# Patient Record
Sex: Male | Born: 1945 | Race: White | Hispanic: No | Marital: Married | Smoking: Current every day smoker
Health system: Southern US, Community
[De-identification: ages and names within clinical notes are randomized; demographics above are authoritative.]

## PROBLEM LIST (undated history)

## (undated) DIAGNOSIS — G4700 Insomnia, unspecified: Secondary | ICD-10-CM

## (undated) DIAGNOSIS — K219 Gastro-esophageal reflux disease without esophagitis: Secondary | ICD-10-CM

## (undated) DIAGNOSIS — R413 Other amnesia: Secondary | ICD-10-CM

## (undated) DIAGNOSIS — N529 Male erectile dysfunction, unspecified: Secondary | ICD-10-CM

## (undated) DIAGNOSIS — M545 Low back pain, unspecified: Secondary | ICD-10-CM

## (undated) DIAGNOSIS — F32A Depression, unspecified: Secondary | ICD-10-CM

## (undated) DIAGNOSIS — I4891 Unspecified atrial fibrillation: Secondary | ICD-10-CM

## (undated) DIAGNOSIS — N400 Enlarged prostate without lower urinary tract symptoms: Secondary | ICD-10-CM

## (undated) DIAGNOSIS — R918 Other nonspecific abnormal finding of lung field: Secondary | ICD-10-CM

## (undated) DIAGNOSIS — I10 Essential (primary) hypertension: Secondary | ICD-10-CM

## (undated) DIAGNOSIS — J449 Chronic obstructive pulmonary disease, unspecified: Secondary | ICD-10-CM

## (undated) DIAGNOSIS — I2510 Atherosclerotic heart disease of native coronary artery without angina pectoris: Secondary | ICD-10-CM

## (undated) DIAGNOSIS — F418 Other specified anxiety disorders: Secondary | ICD-10-CM

## (undated) DIAGNOSIS — F329 Major depressive disorder, single episode, unspecified: Secondary | ICD-10-CM

## (undated) DIAGNOSIS — K4090 Unilateral inguinal hernia, without obstruction or gangrene, not specified as recurrent: Secondary | ICD-10-CM

## (undated) DIAGNOSIS — G459 Transient cerebral ischemic attack, unspecified: Secondary | ICD-10-CM

## (undated) DIAGNOSIS — F5104 Psychophysiologic insomnia: Secondary | ICD-10-CM

## (undated) DIAGNOSIS — M1990 Unspecified osteoarthritis, unspecified site: Secondary | ICD-10-CM

## (undated) HISTORY — DX: Psychophysiologic insomnia: F51.04

## (undated) HISTORY — DX: Gastro-esophageal reflux disease without esophagitis: K21.9

## (undated) HISTORY — DX: Other amnesia: R41.3

## (undated) HISTORY — DX: Male erectile dysfunction, unspecified: N52.9

## (undated) HISTORY — DX: Low back pain: M54.5

## (undated) HISTORY — DX: Unspecified atrial fibrillation: I48.91

## (undated) HISTORY — DX: Benign prostatic hyperplasia without lower urinary tract symptoms: N40.0

## (undated) HISTORY — DX: Depression, unspecified: F32.A

## (undated) HISTORY — DX: Low back pain, unspecified: M54.50

## (undated) HISTORY — DX: Chronic obstructive pulmonary disease, unspecified: J44.9

## (undated) HISTORY — PX: HERNIA REPAIR: SHX51

## (undated) HISTORY — DX: Unspecified osteoarthritis, unspecified site: M19.90

## (undated) HISTORY — PX: APPENDECTOMY: SHX54

## (undated) HISTORY — DX: Unilateral inguinal hernia, without obstruction or gangrene, not specified as recurrent: K40.90

## (undated) HISTORY — PX: ROTATOR CUFF REPAIR: SHX139

## (undated) HISTORY — DX: Essential (primary) hypertension: I10

## (undated) HISTORY — DX: Transient cerebral ischemic attack, unspecified: G45.9

## (undated) HISTORY — DX: Major depressive disorder, single episode, unspecified: F32.9

## (undated) HISTORY — DX: Insomnia, unspecified: G47.00

## (undated) HISTORY — DX: Other specified anxiety disorders: F41.8

## (undated) HISTORY — DX: Atherosclerotic heart disease of native coronary artery without angina pectoris: I25.10

## (undated) HISTORY — DX: Other nonspecific abnormal finding of lung field: R91.8

---

## 2006-05-09 ENCOUNTER — Emergency Department: Payer: Self-pay | Admitting: Emergency Medicine

## 2007-07-31 ENCOUNTER — Emergency Department: Payer: Self-pay | Admitting: Emergency Medicine

## 2007-09-19 ENCOUNTER — Other Ambulatory Visit: Payer: Self-pay

## 2007-09-19 ENCOUNTER — Emergency Department: Payer: Self-pay | Admitting: Unknown Physician Specialty

## 2007-09-26 ENCOUNTER — Ambulatory Visit: Payer: Self-pay | Admitting: Unknown Physician Specialty

## 2008-01-27 ENCOUNTER — Emergency Department: Payer: Self-pay | Admitting: Emergency Medicine

## 2008-01-28 ENCOUNTER — Other Ambulatory Visit: Payer: Self-pay

## 2008-04-28 ENCOUNTER — Emergency Department (HOSPITAL_COMMUNITY): Admission: EM | Admit: 2008-04-28 | Discharge: 2008-04-28 | Payer: Self-pay | Admitting: Emergency Medicine

## 2008-04-30 ENCOUNTER — Emergency Department (HOSPITAL_COMMUNITY): Admission: EM | Admit: 2008-04-30 | Discharge: 2008-04-30 | Payer: Self-pay | Admitting: Family Medicine

## 2008-05-07 ENCOUNTER — Emergency Department (HOSPITAL_COMMUNITY): Admission: EM | Admit: 2008-05-07 | Discharge: 2008-05-07 | Payer: Self-pay | Admitting: Emergency Medicine

## 2013-07-14 ENCOUNTER — Encounter: Payer: Self-pay | Admitting: Emergency Medicine

## 2013-07-15 ENCOUNTER — Ambulatory Visit (INDEPENDENT_AMBULATORY_CARE_PROVIDER_SITE_OTHER): Payer: Medicare Other | Admitting: Emergency Medicine

## 2013-07-15 ENCOUNTER — Encounter: Payer: Self-pay | Admitting: Emergency Medicine

## 2013-07-15 ENCOUNTER — Other Ambulatory Visit: Payer: Medicare Other

## 2013-07-15 VITALS — BP 124/60 | HR 75 | Temp 96.6°F | Ht 70.0 in | Wt 175.6 lb

## 2013-07-15 DIAGNOSIS — F17200 Nicotine dependence, unspecified, uncomplicated: Secondary | ICD-10-CM | POA: Insufficient documentation

## 2013-07-15 DIAGNOSIS — J449 Chronic obstructive pulmonary disease, unspecified: Secondary | ICD-10-CM

## 2013-07-15 DIAGNOSIS — K219 Gastro-esophageal reflux disease without esophagitis: Secondary | ICD-10-CM | POA: Insufficient documentation

## 2013-07-15 DIAGNOSIS — R918 Other nonspecific abnormal finding of lung field: Secondary | ICD-10-CM | POA: Insufficient documentation

## 2013-07-15 NOTE — Patient Instructions (Addendum)
Spirometry today Blood work today We will repeat your CT scan of the chest September 2014 Continue your [employer] and Advair + albuterol as needed We agreed today that you will work to get your cigarettes down to 15 a day by out next visit Follow with Dr Delton Coombes in September after the CT scan is done to review.

## 2013-07-15 NOTE — Assessment & Plan Note (Signed)
Needs repeat CT scan in 3 months at [facility] then f/u

## 2013-07-15 NOTE — Assessment & Plan Note (Signed)
-   continue tudorza and advair - check a1-AT - spiro today - smoking cessation

## 2013-07-15 NOTE — Assessment & Plan Note (Signed)
Discussed cessation in detail today. He has had some success w chantix and vaporized. We set goal to get down to 15 cig/day by next visit, then we will discuss a quit date and chantix.

## 2013-07-15 NOTE — Progress Notes (Signed)
Subjective:    Patient ID: Nicholas Norris, male    DOB: 02/15/1947, 67 y.o.   MRN: [id number]   HPI 67 yo man, tobacco use (100+ pk-yrs), hx COPD dx 2-59yrs ago, diverticular disease, GERD, positive PPD (repeat testing was negative), referred for pulmonary nodules. He has been tried on Spiriva before. Is currently on tudorza and advair. He has had spirometry before, not recently. He underwent CT abd for diverticuli, showed nodules and prompted CT chest in May. Repeat CT done 06/03/13 > 7mm RLL nodule, 2 smaller nodules, stable over 1 month   Review of Systems  Constitutional: Negative for fever and unexpected weight change.  HENT: Negative for ear pain, nosebleeds, congestion, sore throat, rhinorrhea, sneezing, trouble swallowing, dental problem, postnasal drip and sinus pressure.   Eyes: Negative for redness and itching.  Respiratory: Positive for shortness of breath. Negative for cough, chest tightness and wheezing.   Cardiovascular: Negative for palpitations and leg swelling.  Gastrointestinal: Negative for nausea and vomiting.  Genitourinary: Negative for dysuria.  Musculoskeletal: Negative for joint swelling.  Skin: Negative for rash.  Neurological: Negative for headaches.  Hematological: Does not bruise/bleed easily.  Psychiatric/Behavioral: Positive for dysphoric mood. The patient is nervous/anxious.     Past Medical History  Diagnosis Date  . Arthritis   . Impotence   . Insomnia   . GERD (gastroesophageal reflux disease)   . Depression with anxiety   . Inguinal hernia   . BPH (benign prostatic hyperplasia)   . Low back pain   . Memory loss   . Lung nodules   . COPD (chronic obstructive pulmonary disease)      Family History  Problem Relation Age of Onset  . Alzheimer's disease Mother   . Heart disease Father      History   Social History  . Marital Status: Married    Spouse Name: N/A    Number of Children: N/A  . Years of Education: N/A   Occupational History   . tree work    Social History Main Topics  . Smoking status: Current Every Day Smoker -- 2.00 packs/day for 57 years    Types: Cigarettes  . Smokeless tobacco: Not on file     Comment: pt also uses vapor cig--07/15/13  . Alcohol Use: No  . Drug Use: No  . Sexually Active: Not on file   Other Topics Concern  . Not on file   Social History Narrative  . No narrative on file     Allergies  Allergen Reactions  . Codeine Other (See Comments)    Upset stomach     No outpatient prescriptions prior to visit.   No facility-administered medications prior to visit.         Objective:   Physical Exam Filed Vitals:   07/15/13 1035  BP: 124/60  Pulse: 75  Temp: 96.6 F (35.9 C)  TempSrc: Oral  Height: 5\' 10"  (1.778 m)  Weight: 175 lb 9.6 oz (79.652 kg)  SpO2: 99%   Gen: Pleasant, well-nourished, in no distress,  normal affect  ENT: No lesions,  mouth clear,  oropharynx clear, no postnasal drip  Neck: No JVD, no TMG, no carotid bruits  Lungs: No use of accessory muscles, B end exp wheezes, no rales or rhonchi  Cardiovascular: RRR, heart sounds normal, no murmur or gallops, no peripheral edema  Musculoskeletal: No deformities, no cyanosis or clubbing  Neuro: alert, non focal  Skin: Warm, no lesions or rashes     Assessment &  Plan:  Pulmonary nodules Needs repeat CT scan in 3 months at [facility] then f/u  Tobacco use disorder Discussed cessation in detail today. He has had some success w chantix and vaporized. We set goal to get down to 15 cig/day by next visit, then we will discuss a quit date and chantix.   COPD (chronic obstructive pulmonary disease) - continue tudorza and advair - check a1-AT - spiro today - smoking cessation

## 2013-07-23 LAB — ALPHA-1 ANTITRYPSIN PHENOTYPE: A-1 Antitrypsin: 143 mg/dL (ref 83–199)

## 2013-07-27 ENCOUNTER — Telehealth: Payer: Self-pay | Admitting: Emergency Medicine

## 2013-07-27 NOTE — Progress Notes (Signed)
Quick Note:  ATC patient, no answer [id number] ______ 

## 2013-07-27 NOTE — Progress Notes (Signed)
Quick Note:  Spoke with patients spouse, informed her of results as listed below Verbalized understanding and nothing further needed at this time ______

## 2013-07-28 NOTE — Telephone Encounter (Signed)
Notes Recorded by Leslye Peer, MD on 07/26/2013 at 11:30 PM Please notify pt that his a1-AT is normal. Thanks  -----  Called, spoke with pt.  Informed her of above results per RB.  She verbalized understanding.

## 2013-09-21 ENCOUNTER — Encounter: Payer: Self-pay | Admitting: Emergency Medicine

## 2013-09-21 ENCOUNTER — Ambulatory Visit (INDEPENDENT_AMBULATORY_CARE_PROVIDER_SITE_OTHER): Payer: Medicare Other | Admitting: Emergency Medicine

## 2013-09-21 VITALS — BP 122/58 | HR 93 | Temp 98.0°F | Ht 70.5 in | Wt 168.6 lb

## 2013-09-21 DIAGNOSIS — R918 Other nonspecific abnormal finding of lung field: Secondary | ICD-10-CM

## 2013-09-21 DIAGNOSIS — J449 Chronic obstructive pulmonary disease, unspecified: Secondary | ICD-10-CM

## 2013-09-21 DIAGNOSIS — K529 Noninfective gastroenteritis and colitis, unspecified: Secondary | ICD-10-CM

## 2013-09-21 DIAGNOSIS — K5289 Other specified noninfective gastroenteritis and colitis: Secondary | ICD-10-CM

## 2013-09-21 DIAGNOSIS — F17200 Nicotine dependence, unspecified, uncomplicated: Secondary | ICD-10-CM

## 2013-09-21 MED ORDER — PROMETHAZINE HCL 12.5 MG PO TABS: 12.5000 mg | ORAL_TABLET | Freq: Four times a day (QID) | ORAL | Status: DC | PRN

## 2013-09-21 NOTE — Patient Instructions (Addendum)
We will repeat your CT scan in 6 months We discussed smoking cessation today Continue your inhaled medications as you are atking them You need to get a flu shot when this illness passes Use phenergan as needed for nausea You may try imodium as needed for diarrhea.  Drink plenty of fluids until your stomach cramping resolves.  Follow with Dr Delton Coombes in 6 months to review your CT scan or sooner if you have any problems

## 2013-09-21 NOTE — Assessment & Plan Note (Signed)
Appears to be stable but severe.  - discussed smoking again today, techniques for cutting down  - continue inhaled regimen.

## 2013-09-21 NOTE — Assessment & Plan Note (Signed)
-   imodium prn - phenergan prn  - fluids and symptomatic relief.

## 2013-09-21 NOTE — Assessment & Plan Note (Signed)
-   stable by CT, repeat in 6 months.

## 2013-09-21 NOTE — Assessment & Plan Note (Signed)
He did not cut down as we discussed. Revisited techniques and reasons to stop w him today

## 2013-09-21 NOTE — Progress Notes (Signed)
  Subjective:    Patient ID: Nicholas Norris, male    DOB: June 23, 1946, 67 y.o.   MRN: [id number]   HPI 67 yo man, tobacco use (100+ pk-yrs), hx COPD dx 2-26yrs ago, diverticular disease, GERD, positive PPD (repeat testing was negative), referred for pulmonary nodules. He has been tried on Spiriva before. Is currently on tudorza and advair. He has had spirometry before, not recently. He underwent CT abd for diverticuli, showed nodules and prompted CT chest in May. Repeat CT done 06/03/13 > 7mm RLL nodule, 2 smaller nodules, stable over 1 month  ROV 09/21/13 -- Hx COPD, positive PPD, nodules on CT chest in 6/14.  Repeat scan done 09/15/13 >> no change in pulm nodules. On tudorza and advair. Spoke last time about d/c cigarettes > goal down to 15 a day, he was unable to get down and currently on 2 pks a day. a1-AT was MM. He states that his breathing has been unchanged, very SOB w exertion. He has fatigue, nausea, diarrhea x 2 days.    Review of Systems  Constitutional: Negative for fever and unexpected weight change.  HENT: Negative for ear pain, nosebleeds, congestion, sore throat, rhinorrhea, sneezing, trouble swallowing, dental problem, postnasal drip and sinus pressure.   Eyes: Negative for redness and itching.  Respiratory: Positive for shortness of breath. Negative for cough, chest tightness and wheezing.   Cardiovascular: Negative for palpitations and leg swelling.  Gastrointestinal: Negative for nausea and vomiting.  Genitourinary: Negative for dysuria.  Musculoskeletal: Negative for joint swelling.  Skin: Negative for rash.  Neurological: Negative for headaches.  Hematological: Does not bruise/bleed easily.  Psychiatric/Behavioral: Positive for dysphoric mood. The patient is nervous/anxious.       Objective:   Physical Exam Filed Vitals:   09/21/13 1033  BP: 122/58  Pulse: 93  Temp: 98 F (36.7 C)  TempSrc: Oral  Height: 5' 10.5" (1.791 m)  Weight: 168 lb 9.6 oz (76.476 kg)  SpO2:  96%   Gen: Pleasant, well-nourished, in no distress,  normal affect  ENT: No lesions,  mouth clear,  oropharynx clear, no postnasal drip  Neck: No JVD, no TMG, no carotid bruits  Lungs: No use of accessory muscles, B end exp wheezes, no rales or rhonchi  Cardiovascular: RRR, heart sounds normal, no murmur or gallops, no peripheral edema  Musculoskeletal: No deformities, no cyanosis or clubbing  Neuro: alert, non focal  Skin: Warm, no lesions or rashes     Assessment & Plan:  COPD (chronic obstructive pulmonary disease) Appears to be stable but severe.  - discussed smoking again today, techniques for cutting down  - continue inhaled regimen.    Gastroenteritis - imodium prn - phenergan prn  - fluids and symptomatic relief.   Pulmonary nodules - stable by CT, repeat in 6 months.   Tobacco use disorder He did not cut down as we discussed. Revisited techniques and reasons to stop w him today

## 2013-10-28 ENCOUNTER — Encounter: Payer: Self-pay | Admitting: Emergency Medicine

## 2014-06-29 ENCOUNTER — Encounter: Payer: Self-pay | Admitting: Emergency Medicine

## 2015-02-24 ENCOUNTER — Inpatient Hospital Stay: Payer: Self-pay | Admitting: Internal Medicine

## 2015-02-24 DIAGNOSIS — I361 Nonrheumatic tricuspid (valve) insufficiency: Secondary | ICD-10-CM

## 2015-02-25 ENCOUNTER — Ambulatory Visit: Payer: Self-pay | Admitting: Neurology

## 2015-02-25 DIAGNOSIS — J441 Chronic obstructive pulmonary disease with (acute) exacerbation: Secondary | ICD-10-CM

## 2015-02-25 DIAGNOSIS — I480 Paroxysmal atrial fibrillation: Secondary | ICD-10-CM

## 2015-03-01 ENCOUNTER — Telehealth: Payer: Self-pay

## 2015-03-01 NOTE — Telephone Encounter (Signed)
[facility].  Patient understands to follow up with provider Dr. Kirke CorinArida on 03/10/15 at 3:00 [facility]. Patient understands discharge instructions? yes Patient understands medications and regiment? yes Patient understands to bring all medications to this visit? yes  Spoke w/ pt's wife.  She reports that she had pt's pharmacist go over his meds w/ them.

## 2015-03-10 ENCOUNTER — Encounter: Payer: Self-pay | Admitting: Cardiovascular Disease

## 2015-03-10 ENCOUNTER — Ambulatory Visit (INDEPENDENT_AMBULATORY_CARE_PROVIDER_SITE_OTHER): Payer: Medicare Other | Admitting: Cardiovascular Disease

## 2015-03-10 ENCOUNTER — Encounter (INDEPENDENT_AMBULATORY_CARE_PROVIDER_SITE_OTHER): Payer: Self-pay

## 2015-03-10 VITALS — BP 137/76 | HR 68 | Ht 70.0 in | Wt 163.0 lb

## 2015-03-10 DIAGNOSIS — I4891 Unspecified atrial fibrillation: Secondary | ICD-10-CM

## 2015-03-10 DIAGNOSIS — I480 Paroxysmal atrial fibrillation: Secondary | ICD-10-CM | POA: Insufficient documentation

## 2015-03-10 DIAGNOSIS — Z720 Tobacco use: Secondary | ICD-10-CM | POA: Insufficient documentation

## 2015-03-10 MED ORDER — XARELTO 20 MG PO TABS: 20.0000 mg | ORAL_TABLET | Freq: Every day | ORAL | Status: DC

## 2015-03-10 MED ORDER — METOPROLOL TARTRATE 25 MG PO TABS: 25.0000 mg | ORAL_TABLET | Freq: Two times a day (BID) | ORAL | Status: DC

## 2015-03-10 NOTE — Assessment & Plan Note (Signed)
I discussed with him the importance of smoking cessation but he does not think he can quit at the present time.

## 2015-03-10 NOTE — Progress Notes (Signed)
HPI  This is a 69 year old man who was referred from [facility] regarding paroxysmal atrial fibrillation. He has no previous cardiac history. He has known history of extensive tobacco use, COPD, depression, GERD, BPH, recent TIA and paroxysmal atrial fibrillation. He was hospitalized at [facility] on February 26 previously for left facial droop and slurred speech. These changes were reversible. MRI of the brain showed no intracranial abnormality. Carotid Doppler showed no significant disease bilaterally. 2-D echo showed an ejection fraction of 50-55% with no significant valvular abnormalities. He was noted to have atrial fibrillation And converted to sinus rhythm spontaneously. He was started on Xarelto for anticoagulation. He has been doing reasonably well since hospital discharge. No exertional chest pain. He has chronic exertional shortness of breath. No palpitations, syncope or presyncope. He continues to smoke 2 packs per day and has no desire to quit.   No Known Allergies   No current outpatient prescriptions on file prior to visit.   No current facility-administered medications on file prior to visit.     Past Medical History  Diagnosis Date  . A-fib   . TIA (transient ischemic attack)   . COPD (chronic obstructive pulmonary disease)   . Depression   . Gastroesophageal reflux   . BPH (benign prostatic hypertrophy)   . Chronic insomnia      Past Surgical History  Procedure Laterality Date  . Hernia repair    . Rotator cuff repair Left   . Appendectomy       Family History  Problem Relation Age of Onset  . Heart attack Father      History   Social History  . Marital Status: Married    Spouse Name: N/A  . Number of Children: N/A  . Years of Education: N/A   Occupational History  . Not on file.   Social History Main Topics  . Smoking status: Current Every Day Smoker -- 2.00 packs/day for 59 years    Types: Cigarettes  . Smokeless tobacco: Not on file  . Alcohol Use:  No  . Drug Use: No  . Sexual Activity: Not on file   Other Topics Concern  . Not on file   Social History Narrative  . No narrative on file      PHYSICAL EXAM   BP 137/76 mmHg  Pulse 68  Ht  (1.778 m)  Wt 163 lb (73.936 kg)  BMI 23.39 kg/m2 Constitutional: He is oriented to person, place, and time. He appears well-developed and well-nourished. No distress.  HENT: No nasal discharge.  Head: Normocephalic and atraumatic.  Eyes: Pupils are equal and round.  No discharge. Neck: Normal range of motion. Neck supple. No JVD present. No thyromegaly present.  Cardiovascular: Normal rate, regular rhythm, normal heart sounds. Exam reveals no gallop and no friction rub. No murmur heard.  Pulmonary/Chest: Effort normal and breath sounds normal. No stridor. No respiratory distress. He has no wheezes. He has no rales. He exhibits no tenderness.  Abdominal: Soft. Bowel sounds are normal. He exhibits no distension. There is no tenderness. There is no rebound and no guarding.  Musculoskeletal: Normal range of motion. He exhibits no edema and no tenderness.  Neurological: He is alert and oriented to person, place, and time. Coordination normal.  Skin: Skin is warm and dry. No rash noted. He is not diaphoretic. No erythema. No pallor.  Psychiatric: He has a normal mood and affect. His behavior is normal. Judgment and thought content normal.  XBJ:[id number]:Sinus  Rhythm  -With sinus arrhythmia WITHIN NORMAL LIMITS   ASSESSMENT AND PLAN

## 2015-03-10 NOTE — Assessment & Plan Note (Signed)
The patient has paroxysmal atrial fibrillation. CHADS VASc score is 3 with recent TIA. Thus, I recommend lifelong anticoagulation. He is tolerating this with no side effects. I had a prolonged discussion with him about the importance of avoiding physical injuries. 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 I recommended that he does not use that and he should avoid climbing ladders.

## 2015-03-10 NOTE — Patient Instructions (Signed)
Continue same medications.   Your physician wants you to follow-up in: 6 months.  You will receive a reminder letter in the mail two months in advance. If you don't receive a letter, please call our office to schedule the follow-up appointment.  

## 2015-04-07 ENCOUNTER — Encounter: Payer: Self-pay | Admitting: Emergency Medicine

## 2015-04-24 ENCOUNTER — Telehealth: Payer: Self-pay

## 2015-04-24 NOTE — Telephone Encounter (Signed)
Pt wife called, states pt needs Xarelto samples

## 2015-04-25 NOTE — Telephone Encounter (Signed)
Notified patient's wife there is no xarelto at this time but the xarelto rep will be here at lunch. Told we will call back to let them know when available.

## 2015-04-30 NOTE — H&P (Signed)
PATIENT NAME:  Nicholas Norris, Nicholas Norris MR#:  [id number] DATE OF BIRTH:  27-Feb-1946  DATE OF ADMISSION:  02/24/2015  REFERRING PHYSICIAN:  Dr. Gwendolyn Grant.   PRIMARY CARE PRACTITIONER: Nonlocal.   ADMITTING DOCTOR:  Dr. Jonnie Kind.   CHIEF COMPLAINT: Left facial droop associated with slurred speech noticed by his wife.    HISTORY OF PRESENT ILLNESS: A 69 year old Caucasian male with history of COPD, depression/generalized anxiety disorder/chronic insomnia, active smoker, BPH, who presents with the complaints of left facial droop with associated slurred speech as noticed by his wife at around 12:30 p.m. today. The patient stated that while he was cutting trees in his backyard he suddenly just felt some dizziness and falling, which he also just had funny feeling on the left side of the face, following which he developed some drooping of the left side of the face. His wife also noticed him having slurred speech. No associated weakness or numbness of any extremities. EMS was called and the patient was brought to the Emergency Room for further evaluation. By the time the patient reached Emergency Room his neurological symptoms resolved except for some just funny feeling on the left side of the face. No history of any chest pain, shortness of breath, palpitations. No recent fever, cough. No nausea, vomiting, diarrhea. No loss of consciousness. In the Emergency Room the patient was evaluated by the ED physician and was noted to have a normal neurological examination and his laboratory work was also normal except for slightly elevated blood sugars and a CT of the head was negative for any acute intracranial pathology.  The patient had an EKG done which showed atrial fibrillation with a controlled ventricular rate which is kind of a new onset.  Neurology on call was consulted by the ED physician because of the acute neurological symptoms and according to the ED physician neurology recommended against TPA since the  neurological symptoms resolved completely in the Emergency Room and does not have any ongoing neurologic signs.  The patient was started on heparin drip because of new onset atrial fibrillation and the hospitalist service was consulted for further evaluation and management. The patient is currently comfortably resting in the bed. Denies any complaints such as focal weakness, numbness. No dizziness. No speech disturbances, no swallowing disturbances.    PAST MEDICAL HISTORY:  1. COPD.   2. Chronic insomnia.  3. Depression/anxiety disorder.  4. Benign prostatic hypertrophy.   5. Active smoker.   PAST SURGICAL HISTORY:  1. Left inguinal hernia.  2. Left shoulder surgery.   ALLERGIES: No known drug allergies.   FAMILY HISTORY: Significant for heart problems with the patient's father, brother, and grandfather.   SOCIAL HISTORY: He is married, lives with his wife in his home.  history of smoking about 2-1/2 packs per day for the past  60 + years. History of alcohol usage in the past, quit about 20 years ago. No history of any substance abuse recently.   HOME MEDICATIONS:     1. Advair Diskus 250/50 mcg 1 inhalation 2 times a day.  2. Aleve sodium 2 tablets orally once a day.  3. Alprazolam 0.5 mg tablet 1 tablet orally 3 times a day as needed.  4. Ambien 10 mg 1 tablet orally once at bedtime.  5. Aricept 23 mg 1 tablet orally once a day at bedtime.  6. Aspirin 81 mg 1 tablet orally once a day.  7. Flomax 0.4 mg capsule 1 capsule orally once a day at bedtime.  8.  Imodium 2 mg oral tablet p.r.n. for loose motions.  9. Multivitamin 1 tablet orally once a day.  10. Nexium OTC 20 mg delayed release capsule 1 capsule orally once a day.  11. Spiriva 18 mcg inhalation capsule 1 capsule inhalation once a day.  12. Zoloft 100 mg oral tablet 1 tablet orally at bedtime.    REVIEW OF SYSTEMS:    CONSTITUTIONAL: Negative for fever, chills. No fatigue. No generalized weakness.  EYES: Negative for  blurred vision, double vision. No pain. No redness. No discharge.  EARS, NOSE, AND THROAT: Negative for tinnitus, ear pain, hearing loss, epistaxis, nasal discharge, difficulty swallowing.  RESPIRATORY: Negative for cough, wheezing, dyspnea, hemoptysis, or painful respiration. CARDIOVASCULAR: Negative for chest pain, palpitations, syncopal episodes, orthopnea, dyspnea on exertion, or pedal edema.   GASTROINTESTINAL: Negative for nausea, vomiting, diarrhea, abdominal pain, hematemesis, melena, rectal bleeding.   GENITOURINARY: Negative for dysuria, frequency, urgency, or hematuria.   ENDOCRINE: Negative for polyuria, nocturia, heat or cold intolerance.   HEMATOLOGIC: Negative for anemia, easy bruising, or bleeding.   INTEGUMENTARY: Negative for acne, skin rash, or lesions.   MUSCULOSKELETAL: History of chronic neck and shoulder pain, takes Aleve as needed, currently no acute pain.   NEUROLOGICAL: Positive for left-sided facial droop associated with slurred speech as noticed his wife, as mentioned in history of present illness. No history of CVA, TIA, or seizure disorder in the past.  PSYCHIATRIC: Positive for history of depression/anxiety and is stable on home medications.   PHYSICAL EXAMINATION:  VITAL SIGNS: Temperature 98.2 degrees Fahrenheit, pulse rate 96 per minute, respirations 16 per minute, blood pressure 143/65, oxygen saturation is 100% on room air.  Current vital signs, pulse rate 66 per minute, respirations 20 per minute, blood pressure is 120/74, O2 saturation is 96% on room air.  GENERAL: Well-developed, well-nourished, alert, oriented, no acute distress, comfortably resting in the bed.  HEAD: Atraumatic, normocephalic.  EYES: Pupils are equal, react to light and accommodation. No conjunctival pallor. No icterus. Extraocular movements intact.  NOSE: No drainage. No lesions.  EARS: No drainage. No external lesions.  ORAL CAVITY: No mucosal lesions. No exudates.  NECK: Supple. No  JVD. No thyromegaly. No carotid bruit. Range of motion of neck within normal limits.   RESPIRATORY: Good respiratory effort. Not using accessory muscles of respiration. Bilateral vesicular breath sounds present.  No rales or rhonchi.   CARDIOVASCULAR: S1, S2, regular. No murmurs, gallops, or clicks. Pulses equal at carotid, femoral, and pedal pulses. No peripheral edema.  GASTROINTESTINAL: Abdomen is soft, nontender. No hepatosplenomegaly. No masses. No rigidity. No guarding. Bowel sounds present and equal in all 4 quadrants.  GENITOURINARY: Deferred.  MUSCULOSKELETAL: No joint tenderness or effusion. Range of motion is adequate. Strength and tone equal bilaterally.  SKIN: Inspection within normal limits. No obvious wounds.  LYMPHATIC: No cervical lymphadenopathy.  VASCULAR: Good dorsalis pedis and posterior tibial pulses.  NEUROLOGICAL: Alert, awake, and oriented x 3. Cranial nerves II through XII grossly intact. No sensory deficit. Motor strength is 5/5 in both upper and lower extremities. DTRs 2+ bilaterally and symmetrical. Plantars downgoing.  PSYCHIATRIC: Alert, awake, and oriented x 3. Judgment and insight are adequate. Memory and mood within normal limits.   ANCILLARY DATA:   LABORATORY DATA: Serum glucose 123, BUN 13, creatinine 1.02, sodium 142, potassium 3.9, chloride 109, bicarbonate 27, total calcium 8.7. Total protein 6.8, albumin 3.5, total bilirubin 0.3, alkaline phosphatase 136, AST 30, ALT 20. WBC 10.8, hemoglobin 14.9, hematocrit 44.0, platelet count 237,000. Prothrombin  time 13.3, INR 1.0, PTT 29.2. Urinalysis unremarkable.   IMAGING STUDIES:  Chest x-ray, no active cardiopulmonary disease.    CT of the head without contrast, no acute intracranial abnormality noted.   EKG, atrial fibrillation with ventricular rate of 94 beats per minute, no acute ST-T changes.   ASSESSMENT AND PLAN: A 69 year old Caucasian male with past medical history of chronic obstructive pulmonary  disease, active smoker, benign prostatic hypertrophy, depression/chronic insomnia, presents with acute onset of left-sided facial droop associated with slurred speech which was witnessed by his wife, symptoms resolved completely in the Emergency Department, also noted to have new onset atrial fibrillation with controlled ventricular rate.   1.  Left fascial droop with slurred speech as witnessed by his wife. Symptoms resolved completely. Transient ischemic attack versus cerebrovascular accident, new onset. No TPA per neurology since and symptoms resolved. The patient neurologically stable now.   PLAN: Admit to telemetry, neurologic watch, continue aspirin. Request MRI brain, carotid Doppler, neurology consultation, and echocardiogram, and check fasting lipids.   2.  New onset atrial fibrillation with controlled ventricular rate. The patient on heparin drip.  PLAN: Telemetry monitoring. Cycle cardiac enzymes. Continue heparin drip. Echocardiogram and cardiology consultation requested for further evaluation.   3.  Chronic obstructive pulmonary disease, stable on home medications. Continue same.  4.  Tobacco usage. Counseled to quit smoking and recommend nicotine replacement. The patient agrees, we will start nicotine patch.  5.  Benign prostatic hypertrophy, stable on Flomax, continue same.   6.  Depression/generalized anxiety disorder/chronic insomnia, stable on home medications, continue same.  7.  Deep vein thrombosis prophylaxis. Heparin drip.  8.  Gastrointestinal prophylaxis: PPI.    CODE STATUS: Full code.   TIME SPENT: 50 minutes.    ____________________________ Crissie Figures, MD enr:bu D: 02/24/2015 17:15:39 ET T: 02/24/2015 17:38:28 ET JOB#: [id number]  cc: Crissie Figures, MD, <Dictator>  Crissie Figures MD ELECTRONICALLY SIGNED 03/01/2015 8:36

## 2015-04-30 NOTE — Discharge Summary (Signed)
PATIENT NAME:  Nicholas LawlessWILLIAMS, Nicholas Norris MR#:  [id number] DATE OF BIRTH:  1946/08/21  DATE OF ADMISSION:  02/24/2015 DATE OF DISCHARGE:  02/25/2015  PRIMARY CARE PHYSICIAN: None local.  PRESENTING COMPLAINT: Left-sided facial droop and slurred speech.   DISCHARGE DIAGNOSES:  1.  Transient ischemic attack.  2.  Paroxysmal atrial fibrillation.  3.  Chronic obstructive pulmonary disease.  4.  Ongoing tobacco abuse.  5.  Depression.  6.  Gastroesophageal reflux disease.  7.  Benign prosthetic hypertrophy.  8.  Chronic insomnia.   CONSULTATIONS:  1.  Cardiology, Dr. Julien Nordmannimothy Gollan.  2.  Neurology, Dr. Lonia MadZylikman  PROCEDURES:  1.  CT scan of the head without contrast showing no acute intracranial abnormality.  2.  Chest x-ray shows no active cardiopulmonary disease.  3.  Bilateral carotid ultrasound showing no hemodynamically significant stenosis or plaque on either cervical carotid artery. 4.  MRI of the brain without contrast shows no acute intracranial abnormality. Scattered subcortical T2 hyperdensities predominantly within the anterior frontal lobes bilaterally. This finding is nonspecific but could be seen in the setting of chronic microvascular ischemia. A demyelinating process such as multiple sclerosis, vasculitis, complicated migraine headache, or asthma sequela of a prior infectious or inflammatory process.  5.  A 2-D echocardiogram showing normal ejection fraction of 50%-55%. Low normal global left ventricular systolic function. Grossly no wall motion abnormalities. Normal right ventricular size and systolic function.   HISTORY OF PRESENT ILLNESS: The patient is a very pleasant 69 year old man with past medical history of chronic obstructive pulmonary disease, depression, benign prostatic hypertrophy and current smoking presents with left facial droop and slurred speech. The patient stated that he was working in a bucket, cherry-picker, cutting limbs from a tree when he had a funny feeling on  the left side of his face. He called his wife, who noted him to be slurring. She came to pick him up and EMS was called. The patient was brought to the Emergency Room for further evaluation. By the time he reached the ED his neurologic symptoms had resolved except for some paresthesias on the left side of the face. On presentation he was also found to be in atrial fibrillation with a controlled ventricular rate which was new.    HOSPITAL COURSE BY PROBLEM: 1.  Transient ischemic attack: No findings on CT or MRI. Symptoms completely resolved by the time of presentation to the Emergency Room. His LDL is 91. HDL 44. He was initially started on heparin drip due to new onset atrial fibrillation, however, he converted spontaneously. For paroxysmal atrial fibrillation he was started on Eliquis to prevent further stroke episodes. Carotid Dopplers were negative. A 2-D echocardiogram was normal. No further home health, physical therapy, and occupational therapy needed. 2.  Paroxysmal atrial fibrillation: This is a new diagnosis and the patient has converted to normal sinus rhythm with medication. He will be on Eliquis to reduce risk of stroke. He continues on metoprolol for rate control. He will follow up with Dr. Mariah MillingGollan. 3.  COPD.  The patient did not have a chronic obstructive pulmonary disease exacerbation during this hospitalization. He continues on Advair and Spiriva.  4.  Ongoing tobacco abuse: The patient was counseled multiple times throughout the hospitalization regarding the risks of continuing to smoke cigarettes. He was given a prescription for nicotine inhalation device and advised to stop smoking. 5.  Depression: The patient showed no signs of depression. He continues on Zoloft and Ambien with alprazolam as needed.  6.  Gastroesophageal reflux  disease: Continue on Nexium.   DISCHARGE PHYSICAL EXAMINATION:  VITAL SIGNS: Temperature 98.7, pulse 72, respirations 20, blood pressure 150/74, oxygenation 96%  on room air.  GENERAL: No acute distress.  CARDIOVASCULAR: Regular rate and rhythm. No murmurs, rubs, or gallops. No peripheral edema. Peripheral pulses are 2+.  RESPIRATORY: Lungs a regarding clear to auscultation bilaterally with good air movement.  ABDOMEN: Soft, nontender, and nondistended. Bowel sounds normal.  NEUROLOGIC: Cranial nerves II-XII are intact. Strength and sensation are intact and equal bilaterally.  PSYCHIATRIC: The patient alert and oriented with good insight into his clinical condition.   LABORATORY DATA: Sodium 142, potassium 3.9, chloride 109, bicarbonate 24, BUN 13, creatinine 1.02, glucose 123, LDL 91, HDL 44, triglycerides 84. LFTs normal. Troponin less than 0.02. White blood cells 10.8, hemoglobin 14.9, platelets 237,000, MCV 98. Urinalysis negative for infection with 1 white blood cell per high-powered field.   DISCHARGE MEDICATIONS:  1.  Spiriva 18 mcg 1 capsule inhaled daily.  2.  Multivitamin 1 tablet daily.  3.  Nexium 20 mg 1 tablet daily.  4.  Imodium 2 mg 1 tablet once a day as needed for loose stool.  5.  Alprazolam 0.5 mg 1 tablet 3 times a day as needed.  6.  Ambien 10 mg 1 tablet once a day at bedtime.  7.  Aricept 23 mg 1 tablet once a day at bedtime.  8.  Flomax 0.4 mg 1 capsule once a day at bedtime.  9.  Zoloft 100 mg 1 tablet once a day at bedtime.  10.  Advair Diskus 250 mcg/50 mcg 1 puff inhaled twice a day.  11.  Aspirin 81 mg 1 tablet daily.  12.  Rivaroxaban 20 mg 1 tablet daily.  13.  Metoprolol tartrate 25 mg 1 tablet twice a day.  14.  Nicotine 10 mg inhalation device 1 unit inhaled every 4 hours as needed to replace nicotine.   CONDITION ON DISCHARGE: Stable.   DISPOSITION: The patient is being discharged to home with no further home health needs.   DISCHARGE INSTRUCTIONS:  DIET: Low fat, low sodium, low cholesterol.  ACTIVITY LIMITATIONS: None. TIMEFRAME FOR FOLLOW-UP:  The patient will follow up in 1-2 weeks with Dr. Mariah Milling and  within 1-2 weeks with his primary care physician.  TIME SPENT ON DISCHARGE: 45 minutes.    ____________________________ Ena Dawley. Clent Ridges, MD cpw:mc D: 02/27/2015 20:36:00 ET Norris: 02/28/2015 08:17:02 ET JOB#: [id number]  cc: Santina Evans P. Clent Ridges, MD, <Dictator> Antonieta Iba, MD  Gale Journey MD ELECTRONICALLY SIGNED 03/06/2015 10:43

## 2015-04-30 NOTE — Consult Note (Signed)
General Aspect 69 year old Caucasian male with history of COPD, depression/generalized anxiety disorder/chronic insomnia, active smoker, BPH, who presents with the complaints of left facial droop with associated slurred speech as noticed by his wife at around 12:30 p.m.yesterday. Cardiology was consulted for new atrial fibrillation.   he was cutting trees with a chainsaw in his backyard 85 feet off the ground in a "bucket" and he suddenly  felt some dizziness, malaise, SOB,  which he also just had funny feeling on the left side of the face. He lowered the bucket to the ground, His wife also noticed him having slurred speech. No associated weakness or numbness of any extremities. EMS was called and the patient was brought to the Emergency Room   By the time the patient reached Emergency Room his neurological symptoms resolved except for some  funny feeling on the left side of the face.  EKG showed atrial fibrillation with rate 89 bpm. She does report having some palpitations and SOB. No recent fever, cough. No nausea, vomiting, diarrhea. No loss of consciousness.  CT of the head was negative for any acute intracranial pathology. Neurology on call was consulted by the ED physician because of the acute neurological symptoms and according to the ED physician neurology recommended against TPA since the neurological symptoms resolved completely in the Emergency Room and does not have any ongoing neurologic signs.    The patient was started on heparin drip because of new onset atrial fibrillation. by the time he was transferred to the floor, he was noted to be in NSR Tele reviewed overnight, no further epsiodes of atrial fibrillation.  He does reports having previous epsiodes of palpitations and SOB over teh past few months with associated SOB. sx resolve on their own   Physical Exam:  GEN well developed, well nourished, no acute distress   HEENT hearing intact to voice, moist oral mucosa   NECK  supple   RESP normal resp effort  decreased BS b/l   CARD Regular rate and rhythm  No murmur   ABD denies tenderness  soft   LYMPH negative neck   EXTR negative edema   SKIN normal to palpation   NEURO motor/sensory function intact   PSYCH alert, A+O to time, place, person, good insight   Review of Systems:  Subjective/Chief Complaint left facial drooping, resolved, palpitations and SOB, resolved   General: No Complaints   Skin: No Complaints   ENT: No Complaints   Eyes: No Complaints   Neck: No Complaints   Respiratory: Short of breath   Cardiovascular: Palpitations   Gastrointestinal: No Complaints   Genitourinary: No Complaints   Vascular: No Complaints   Musculoskeletal: No Complaints   Neurologic: No Complaints   Hematologic: No Complaints   Endocrine: No Complaints   Psychiatric: No Complaints   Review of Systems: All other systems were reviewed and found to be negative   Medications/Allergies Reviewed Medications/Allergies reviewed   Family & Social History:  Family and Social History:  Family History Coronary Artery Disease   Social History positive  tobacco   + Tobacco Current (within 1 year)   Place of Living Home     COPD:        Admit Diagnosis:   CVA: Onset Date: 25-Feb-2015, Status: Active, Description: CVA  Home Medications: Medication Instructions Status  Spiriva 18 mcg inhalation capsule 1 cap(s) inhaled once a day Active  multivitamin 1 tab(s) orally once a day Active  aspirin 81 mg oral tablet 1 tab(s) orally  once a day Active  NexIUM OTC 20 mg oral delayed release capsule 1 cap(s) orally once a day Active  Imodium 2 mg oral tablet 1 tab(s) orally once a day Active  Aleve sodium 220 mg oral tablet 2 tab(s) orally once a day Active  ALPRAZolam 0.5 mg oral tablet 1 tab(s) orally 3 times a day, As Needed Active  Ambien 10 mg oral tablet 1 tab(s) orally once a day (at bedtime) Active  Aricept 23 mg oral tablet 1 tab(s)  orally once a day (at bedtime) Active  Flomax 0.4 mg oral capsule 1 cap(s) orally once a day (at bedtime) Active  Zoloft 100 mg oral tablet 1 tab(s) orally once a day (at bedtime) Active  Advair Diskus 250 mcg-50 mcg inhalation powder 1 puff(s) inhaled 2 times a day Active   Lab Results:  Routine Chem:  27-Feb-16 02:02   Cholesterol, Serum 152  Triglycerides, Serum 84  HDL (INHOUSE) 44  VLDL Cholesterol Calculated 17  LDL Cholesterol Calculated 91 (Result(s) reported on 25 Feb 2015 at 03:04AM.)  Cardiac:  26-Feb-16 13:54   Troponin I < 0.02 (0.00-0.05 0.05 ng/mL or less: NEGATIVE  Repeat testing in 3-6 hrs  if clinically indicated. >0.05 ng/mL: POTENTIAL  MYOCARDIAL INJURY. Repeat  testing in 3-6 hrs if  clinically indicated. NOTE: An increase or decrease  of 30% or more on serial  testing suggests a  clinically important change)    21:54   Troponin I < 0.02 (0.00-0.05 0.05 ng/mL or less: NEGATIVE  Repeat testing in 3-6 hrs  if clinically indicated. >0.05 ng/mL: POTENTIAL  MYOCARDIAL INJURY. Repeat  testing in 3-6 hrs if  clinically indicated. NOTE: An increase or decrease  of 30% or more on serial  testing suggests a  clinically important change)  27-Feb-16 02:02   Troponin I < 0.02 (0.00-0.05 0.05 ng/mL or less: NEGATIVE  Repeat testing in 3-6 hrs  if clinically indicated. >0.05 ng/mL: POTENTIAL  MYOCARDIAL INJURY. Repeat  testing in 3-6 hrs if  clinically indicated. NOTE: An increase or decrease  of 30% or more on serial  testing suggests a  clinically important change)   EKG:  Interpretation EKG shows atrial fibrillation, rate 89 bpm, nonspecific ST ABn Tele overnight showing NSR   Radiology Results: XRay:    26-Feb-16 14:07, Chest Portable Single View  Chest Portable Single View   REASON FOR EXAM:    CVA  COMMENTS:       PROCEDURE: DXR - DXR PORTABLE CHEST SINGLE VIEW  - Feb 24 2015  2:07PM     CLINICAL DATA:  Code stroke    EXAM:  PORTABLE  CHEST - 1 VIEW    COMPARISON:  03/21/2014    FINDINGS:  Heart size normal. Pulmonary vascularity normal. Lungs are clear  without infiltrate or effusion.   IMPRESSION:  No active disease.      Electronically Signed    By: Marlan Palauharles  Clark M.D.    On: 02/24/2015 14:11         Verified By: Camelia PhenesAVID C. CLARK, M.D.,  US:    26-Feb-16 17:44, US Carotid Doppler Bilateral  US Carotid Doppler Bilateral   REASON FOR EXAM:    tia/ cva  COMMENTS:       PROCEDURE: US  - US CAROTID DOPPLER BILATERAL  - Feb 24 2015  5:44PM     CLINICAL DATA:  Transient ischemic attack.  Syncope.    EXAM:  BILATERAL CAROTID DUPLEX ULTRASOUND    TECHNIQUE:  Wallace CullensGray  scale imaging, color Doppler and duplex ultrasound were  performed of bilateral carotid and vertebral arteries in the neck.    COMPARISON:  None.  FINDINGS:  Criteria: Quantification of carotid stenosis is based on velocity  parameters that correlate the residualinternal carotid diameter  with NASCET-based stenosis levels, using the diameter of the distal  internal carotid lumen as the denominator for stenosis measurement.    The following velocity measurements were obtained:    RIGHT    ICA:  110/36 cm/sec    CCA:  103/26 cm/sec    SYSTOLIC ICA/CCA RATIO:  1.1  DIASTOLIC ICA/CCA RATIO:  1.4    ECA:  111 cm/sec    LEFT    ICA:  106/28 cm/sec    CCA:  72/19 cm/sec    SYSTOLIC ICA/CCA RATIO:  1.5    DIASTOLIC ICA/CCA RATIO:  1.5    ECA:  100 cm/sec  RIGHT CAROTID ARTERY: No significant plaque or stenosis is noted.    RIGHT VERTEBRAL ARTERY:  Antegrade flow is noted.    LEFT CAROTID ARTERY:  No significant plaque or stenosis is noted.    LEFT VERTEBRAL ARTERY:  Antegrade flow is noted.     IMPRESSION:  No hemodynamically significant stenosis or plaque is noted in either  cervical carotid artery.      Electronically Signed    By: Lupita Raider, M.D.    On: 02/24/2015 18:19         Verified By: Maryagnes Amos,  M.D.,  MRI:    27-Feb-16 08:54, MRI Brain Without Contrast  MRI Brain Without Contrast   REASON FOR EXAM:    tia/ cva- new onset  COMMENTS:       PROCEDURE: MR  - MR BRAIN WO CONTRAST  - Feb 25 2015  8:54AM     CLINICAL DATA:  Right temporal headache. Left-sided facial drooping.  Numbness and dizziness.    EXAM:  MRI HEAD WITHOUT CONTRAST    TECHNIQUE:  Multiplanar, multiecho pulse sequences of the brain and surrounding  structures were obtained without intravenous contrast.  COMPARISON:  CT head without contrast 02/24/2015. Carotid ultrasound  02/24/2015.    FINDINGS:  The diffusion-weighted images demonstrate no evidence for acute or  subacute infarction. No hemorrhage or mass lesion is present. Insert  pass ventricles No significant extraaxial fluid collection is  present.    Scattered subcortical T2 hyperintensities are present predominantly  in the anterior frontal lobes bilaterally, somewhat advanced for  age.    Periventricular white matter changes are evident bilaterally. There  are white matter changes extending into the brainstem.  Flow is present in the major intracranial arteries. The globes and  orbits are intact. Mild mucosal thickening is noted in the anterior  ethmoid air cells and frontal sinuses. Minimal mucosal thickening is  present in the inferior maxillary sinuses. There is some fluid in  the mastoidair cells bilaterally. No obstructing nasopharyngeal  lesion is evident.     IMPRESSION:  1. No acute intracranial abnormality.  2. Scattered subcortical T2 hyperintensities predominantly within  the anterior frontal lobes bilaterally. The finding isnonspecific  but can be seen in the setting of chronic microvascular ischemia, a  demyelinating process such as multiple sclerosis, vasculitis,  complicated migraine headaches, or as the sequelae of a prior  infectious or inflammatory process.  3. Mild anterior sinus disease.  No fluid levels are  present.      Electronically Signed    By: Virl Son.D.  On: 02/25/2015 09:30         Verified By: Jamesetta Orleans. MATTERN, M.D.,  Cardiology:    26-Feb-16 19:48, Echo Doppler  Echo Doppler   REASON FOR EXAM:      COMMENTS:       PROCEDURE: [facility] - ECHO DOPPLER COMPLETE(TRANSTHOR)  - Feb 24 2015  7:48PM     RESULT: Echocardiogram Report    Patient Name:   Nicholas Norris Date of Exam: 02/24/2015  Medical Rec #:  [id number]           Custom1:  Date of Birth:  [birth year]        Height:       71.0 in  Patient Age:    69 years         Weight:       176.0 lb  Patient Gender: M                BSA:          2.00 m??    Indications: Atrial Fib  Sonographer:    Wonda Cerise RDCS  Referring Phys: Phineas Semen, S    Summary:   1. Left ventricular ejection fraction, by visual estimation, is 50 to   55%.   2. Low normal global left ventricular systolic function.   3. Grossly, no wall motion abnormality.   4. Normal right ventricular size and systolic function.   5. Mildly dilated left atrium.   6. Mild tricuspid regurgitation.   7. Normal RVSP   8. Mild dilatation of the ascending aorta and aortic root.  2D AND M-MODE MEASUREMENTS (normal ranges within parentheses):  Left Ventricle: Normal  IVSd (2D):      0.90 cm (0.7-1.1)  LVPWd (2D):     0.88 cm (0.7-1.1) Aorta/LA:                  Normal  LVIDd (2D):     5.19 cm (3.4-5.7) Aortic Root (2D): 3.20 cm (2.4-3.7)  LVIDs (2D):     3.86 cm           Left Atrium (2D): 4.00 cm (1.9-4.0)  LV FS (2D):     25.6 %   (>25%)  LV EF (2D):     50.1 %   (>50%)                                    Right Ventricle:                                    RVd (2D):  LV DIASTOLIC FUNCTION:  MV Peak E: 0.82 m/s E/e' Ratio: 6.80  MV Peak A: 0.84 m/s Decel Time: 222 msec  E/A Ratio: 0.98  SPECTRAL DOPPLER ANALYSIS (where applicable):  Mitral Valve:  MV P1/2 Time: 64.38 msec  MV Area, PHT: 3.42 cm??  Aortic Valve: AoV Max Vel: 1.17 m/s  AoV Peak PG: 5.5 mmHg AoV Mean PG:  LVOT Vmax: 0.90 m/s LVOT VTI: LVOT Diameter: 2.20 cm  AoV Area, Vmax: 2.92 cm?? AoV Area, VTI:  AoV Area, Vmn:  Tricuspid Valve and PA/RV Systolic Pressure: TR Max Velocity: 2.54 m/s RA   Pressure: 5 mmHg RVSP/PASP: 30.8 mmHg  Pulmonic Valve:  PV Max Velocity: 1.00 m/s PV Max PG:4.0 mmHg PV Mean PG:    PHYSICIAN INTERPRETATION:  Left  Ventricle: The left ventricular internal cavity size was normal. LV   posterior wall thickness was normal. No left ventricular hypertrophy.   Global LV systolic function was low normal. Left ventricular ejection   fraction, by visual estimation, is 50 to 55%. Spectral Doppler shows   normal pattern of LV diastolic filling.  Right Ventricle: Normal right ventricular size, wall thickness, and     systolic function. The right ventricular size is normal. Global RV   systolic function is normal.  Left Atrium: The left atrium is mildly dilated.  Right Atrium: The right atrium is normal in size.  Pericardium: There is no evidence of pericardial effusion.  Mitral Valve: The mitral valve is normal in structure. Trace mitral valve   regurgitation is seen.  Tricuspid Valve: The tricuspid valve is normal. Mild tricuspid   regurgitation is visualized. The tricuspid regurgitant velocity is 2.54   m/s, and with an assumed right atrial pressure of , the estimated   right ventricular systolic pressure is normal at 30.8 mmHg.  Aortic Valve: The aortic valve was not well seen. The aortic valve is   structurally normal, with no evidence of sclerosis or stenosis. No   evidence of aortic valve regurgitation is seen.  Pulmonic Valve: The pulmonic valve is normal. Trace pulmonic valve     regurgitation.  Aorta: There is mild dilatation of the ascending aorta and aortic root.    [id number] Julien Nordmann MD  Electronically signed by [id number] Julien Nordmann MD  Signature Date/Time: 02/25/2015/7:49:29 AM    *** Final ***    IMPRESSION:  .        Verified By: Antonieta Iba, M.D., MD    No Known Allergies:   Vital Signs/Nurse's Notes: **Vital Signs.:   27-Feb-16 08:01  Vital Signs Type Q 4hr  Temperature Temperature (F) 98.8  Celsius 37.1  Temperature Source oral  Pulse Pulse 70  Respirations Respirations 20  Systolic BP Systolic BP 152  Diastolic BP (mmHg) Diastolic BP (mmHg) 80  Mean BP 104  Pulse Ox % Pulse Ox % 97  Pulse Ox Activity Level  At rest  Oxygen Delivery Room Air/ 21 %    Impression 69 year old Caucasian male with history of COPD, depression/generalized anxiety disorder/chronic insomnia, active smoker, BPH, who presents with the complaints of left facial droop with associated slurred speech as noticed by his wife at around 12:30 p.m.yesterday. Cardiology was consulted for new atrial fibrillation.  1) Atrial fibrillation: suspect he has been having paroxysmal atrial fibrillation, he reports having previous epsiodes of palpitations and SOB similar to sx yesterday --Echo normal EF, no significant valve disease. Would d/c heparin --Would start xarelto 20 mg daily (give dose now), stop aspirin (coupon for xarelto provided to patient for free month) --start metoprolol 25 mg po BID, take extra metoprolol dose for breakthrough palpitation epsiodes follow up in clinic consider d/c today  2)COPD: long smoking hx recommeded cessation. inhalers he does not want chantix, prefers vapor cigs  3)depression/anxiety seems stable, continue home emds  4)gerd on PPI   Electronic Signatures: Julien Nordmann (MD)  (Signed 27-Feb-16 12:46)  Authored: General Aspect/Present Illness, History and Physical Exam, Review of System, Family & Social History, Past Medical History, Health Issues, Home Medications, Labs, EKG , Radiology, Allergies, Vital Signs/Nurse's Notes, Impression/Plan   Last Updated: 27-Feb-16 12:46 by Julien Nordmann (MD)

## 2015-04-30 NOTE — Consult Note (Signed)
PATIENT NAME:  Nicholas LawlessWILLIAMS, Gilmore T MR#:  [id number] DATE OF BIRTH:  1946-06-17  DATE OF CONSULTATION:  02/25/2015  CONSULTING PHYSICIAN:  Pauletta BrownsYuriy Tomas Schamp, MD  REASON FOR CONSULTATION: Dysarthric speech.  HISTORY OF PRESENT ILLNESS: A 69 year old, Caucasian male with past medical history of COPD, depression, generalized anxiety disorder and he is a smoker presents yesterday with a left facial droop and slurred speech that has been improving in the emergency department. Due to improving symptoms TPA was not given. Currently, the patient is back to baseline and was found to have a new onset of atrial fibrillation. He was started on heparin drip. Currently back to baseline, ambulates without any assistance.   PAST MEDICAL HISTORY: COPD, chronic insomnia, depression, anxiety, benign prostatic hypertrophy, active smoker.  PAST SURGICAL HISTORY: Left inguinal hernia, left shoulder surgery.   ALLERGIES: No known drug allergies.   FAMILY HISTORY: Noncontributory.   SOCIAL HISTORY: Daily smoker of 2-1/2 packs per day for the past 60 years.   HOME MEDICATIONS: Have been reviewed.   REVIEW OF SYSTEMS: Currently no shortness of breath. No chest pain. No abdominal pain. No focal weakness on 1 side of the body compared to the other. Positive history of anxiety, depression.   NEUROLOGICAL EVALUATION: Speech is fluent. No signs of dysarthria. No signs of aphasia. Extraocular movements are intact. The patient is alert, awake and oriented to time and place, location and the reason why he is in the hospital. Motor is 5/5 bilaterally in upper and lower extremities. Reflexes are symmetrical bilaterally. Sensation intact to light touch and temperature. Coordination: Finger-to-nose intact. Gait: The patient is be able to ambulate without any assistance.   ASSESSMENT: A 69 year old male with new onset of atrial fibrillation, likely transient ischemic attack, now back to baseline. The patient was started on heparin  drip.   PLAN: Agree with anticoagulation with Xarelto. Discharge planning from a neurological standpoint: Follow up with neurology as an outpatient.   This was discussed with the patient and the patient's family at bedside. Please call with any questions.    ____________________________ Pauletta BrownsYuriy Ursala Cressy, MD yz:TT D: 02/25/2015 13:28:04 ET T: 02/25/2015 14:37:27 ET JOB#: [id number]  cc: Pauletta BrownsYuriy Aria Jarrard, MD, <Dictator> Pauletta BrownsYURIY Nanci Lakatos MD ELECTRONICALLY SIGNED 03/16/2015 12:07

## 2015-05-01 NOTE — Telephone Encounter (Signed)
Notified patient's wife samples of xarelto 20 mg are available to pick up.

## 2015-05-25 ENCOUNTER — Telehealth: Payer: Self-pay | Admitting: *Deleted

## 2015-05-25 NOTE — Telephone Encounter (Signed)
Spoke with patient's wife xarelto 20 mg samples available to pick up at the front desk.

## 2015-05-25 NOTE — Telephone Encounter (Signed)
Pt wife calling asking for samples of XARELTO Please call when ready.  She states she applied for assistance but they denied her for they were $35, so this is why she is asking for samples. It was just to expensive to get alone.

## 2015-09-01 ENCOUNTER — Telehealth: Payer: Self-pay | Admitting: Emergency Medicine

## 2015-09-01 NOTE — Telephone Encounter (Signed)
lmtcb X1-no option for an extension.

## 2015-09-05 NOTE — Telephone Encounter (Signed)
Phone # to [facility] in chart incorrect Correct phone # is [phone] Left message to call back to find out what records are needed Waiting on return call

## 2015-09-06 NOTE — Telephone Encounter (Signed)
Called and spoke to nurse. Lonie Peak, PA, is requesting last OV note and last CT chest for continuity of care. Records sent to Lonie Peak from [place]. Nothing further needed at this time.

## 2015-12-19 ENCOUNTER — Ambulatory Visit: Payer: Medicare Other | Admitting: Cardiovascular Disease

## 2015-12-19 DIAGNOSIS — R0989 Other specified symptoms and signs involving the circulatory and respiratory systems: Secondary | ICD-10-CM

## 2015-12-20 ENCOUNTER — Encounter: Payer: Self-pay | Admitting: *Deleted

## 2016-02-19 ENCOUNTER — Telehealth: Payer: Self-pay | Admitting: Cardiovascular Disease

## 2016-02-19 NOTE — Telephone Encounter (Signed)
lmov to schedule appt from recall list.   This is 3rd attempt to contact for scheduling.  Deleting recall.

## 2016-07-24 ENCOUNTER — Encounter: Payer: Self-pay | Admitting: Internal Medicine

## 2016-07-24 DIAGNOSIS — I10 Essential (primary) hypertension: Secondary | ICD-10-CM | POA: Insufficient documentation

## 2016-07-24 DIAGNOSIS — I2510 Atherosclerotic heart disease of native coronary artery without angina pectoris: Secondary | ICD-10-CM | POA: Insufficient documentation

## 2016-07-24 NOTE — Progress Notes (Signed)
Opened in error

## 2016-11-07 IMAGING — CT CT HEAD WITHOUT CONTRAST
1 series · 16 of 30 positions shown, 20 images · non-contrast
Comparison: None.

CLINICAL DATA: Slurred speech and left arm pain

EXAM:
CT HEAD WITHOUT CONTRAST
TECHNIQUE: Contiguous axial images were obtained from the base of the skull
through the vertex without intravenous contrast.

[Series 2: head wo · axial · 0.43mm/px · z∈[-128,+6]mm · 16 of 31 slices shown, 20 images]
[im 2/31  brain]
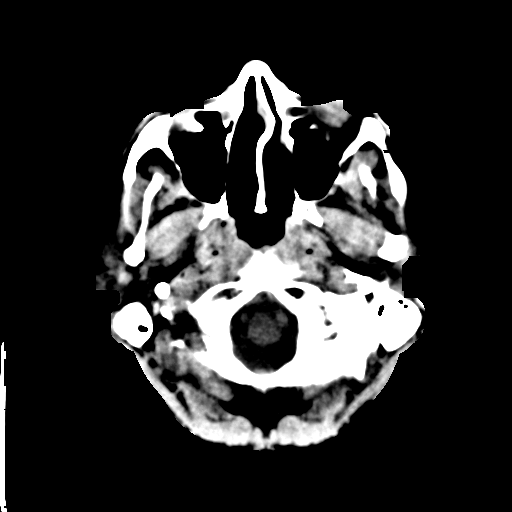
[im 2/31  bone]
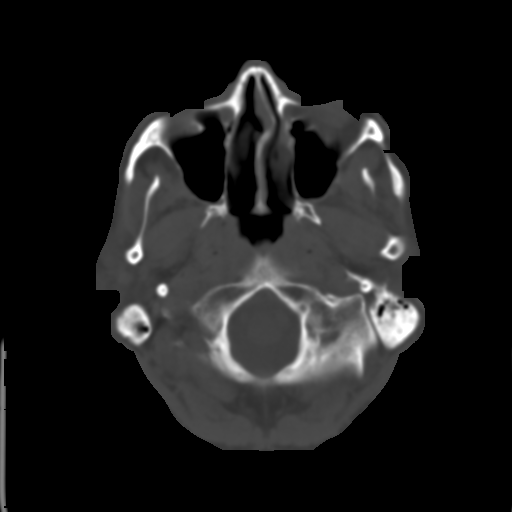
[im 4/31  brain]
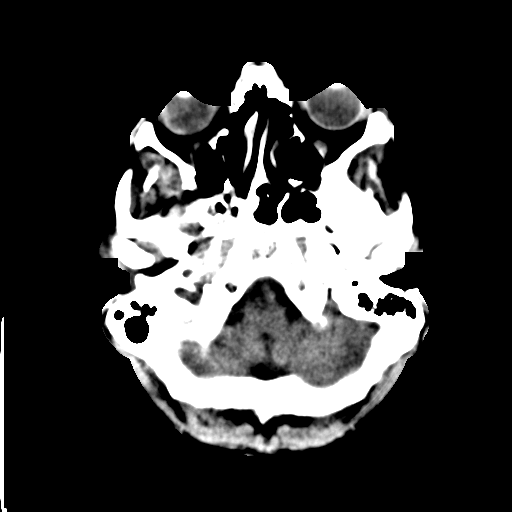
[im 6/31  brain]
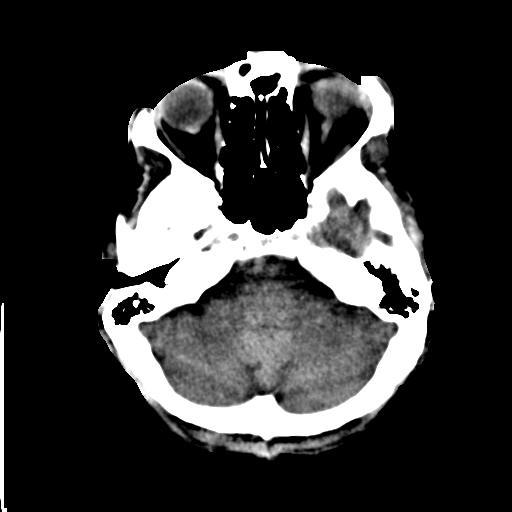
[im 8/31  brain]
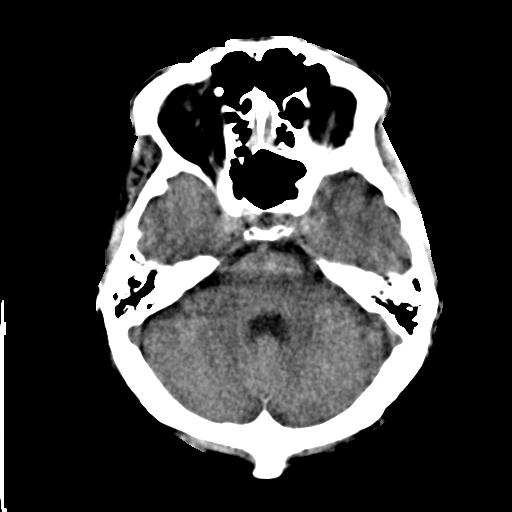
[im 9/31  brain]
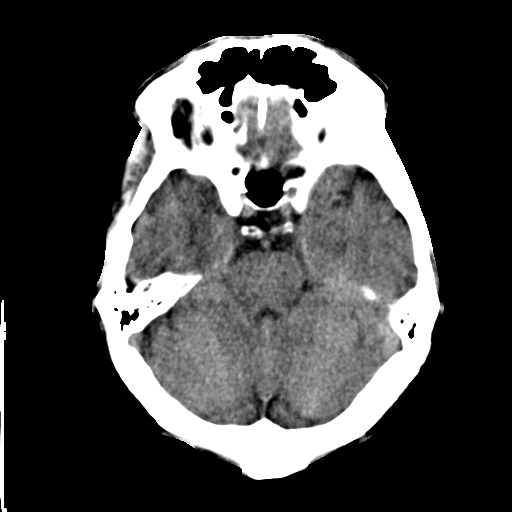
[im 9/31  bone]
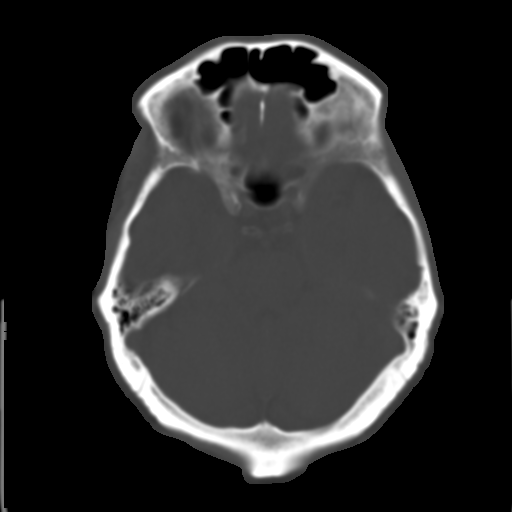
[im 11/31  brain]
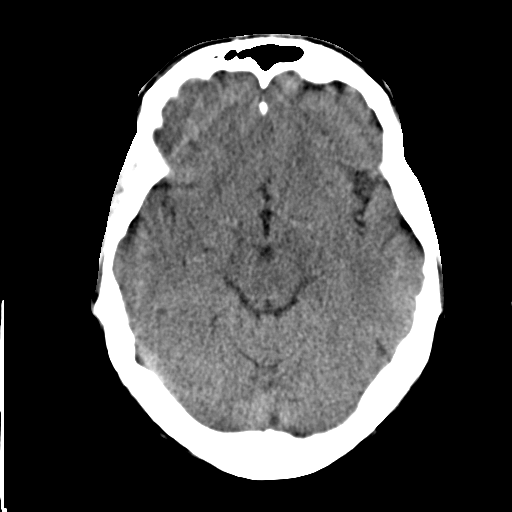
[im 13/31  brain]
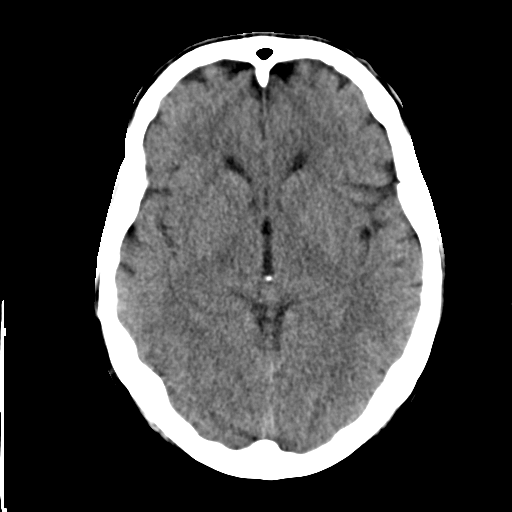
[im 15/31  brain]
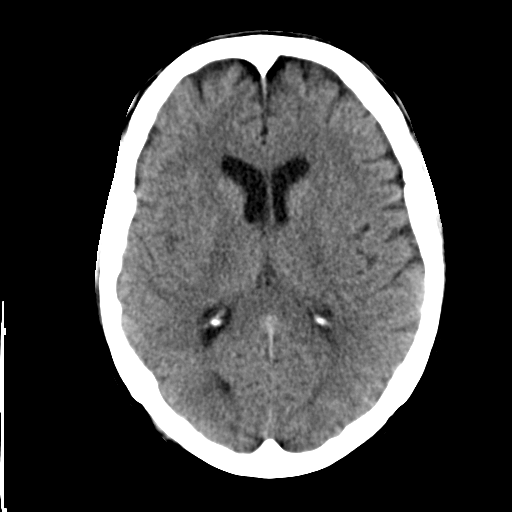
[im 16/31  brain]
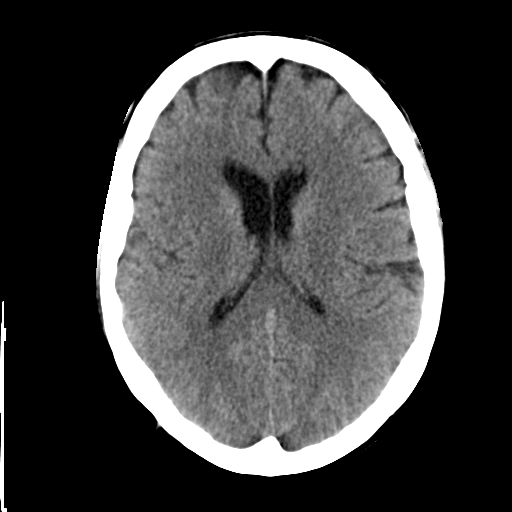
[im 16/31  bone]
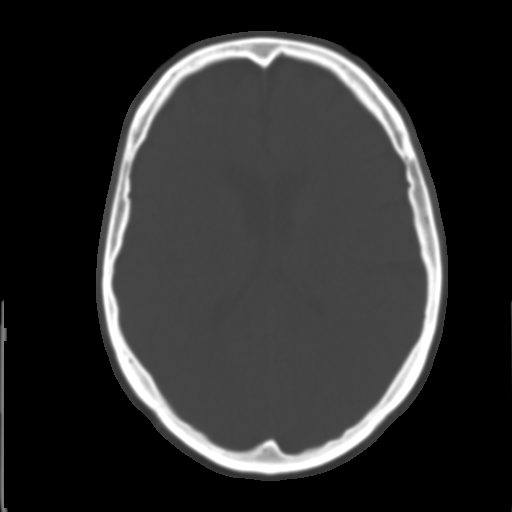
[im 18/31  brain]
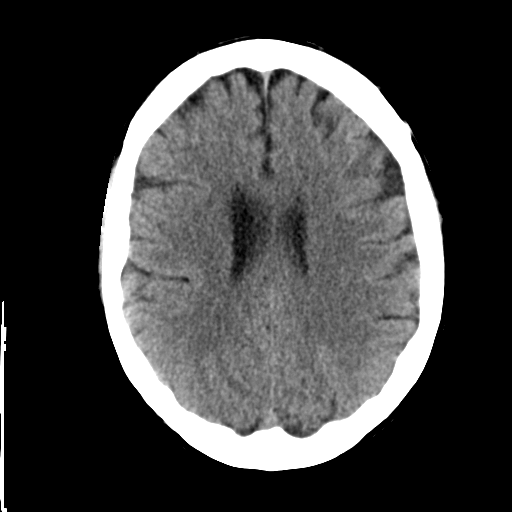
[im 20/31  brain]
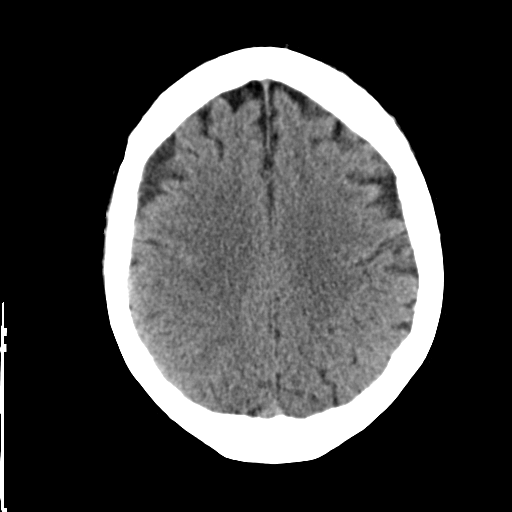
[im 22/31  brain]
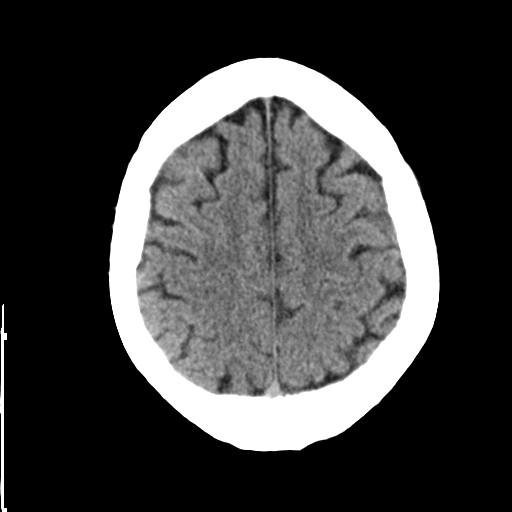
[im 23/31  brain]
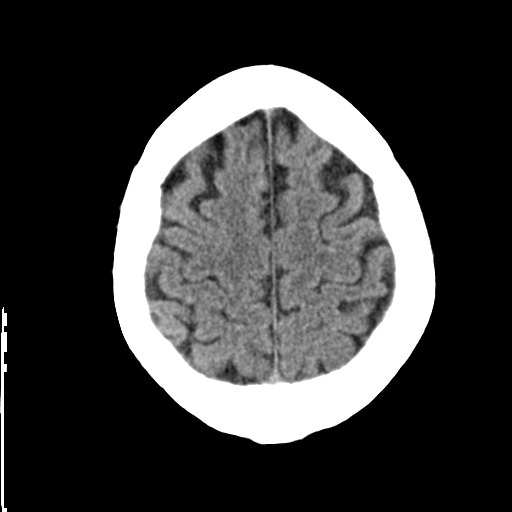
[im 23/31  bone]
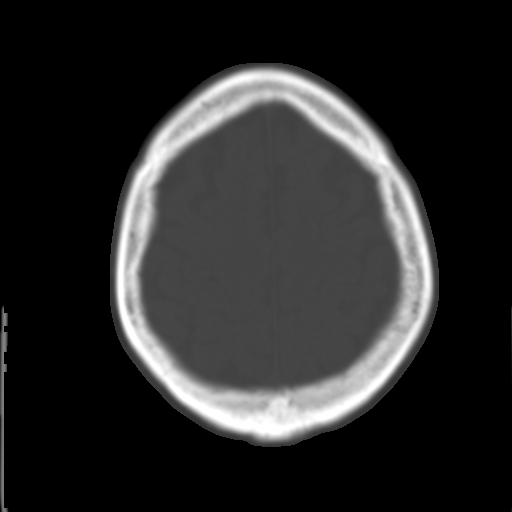
[im 25/31  brain]
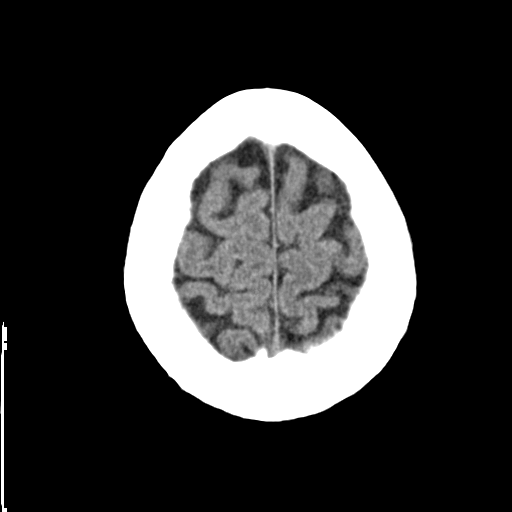
[im 27/31  brain]
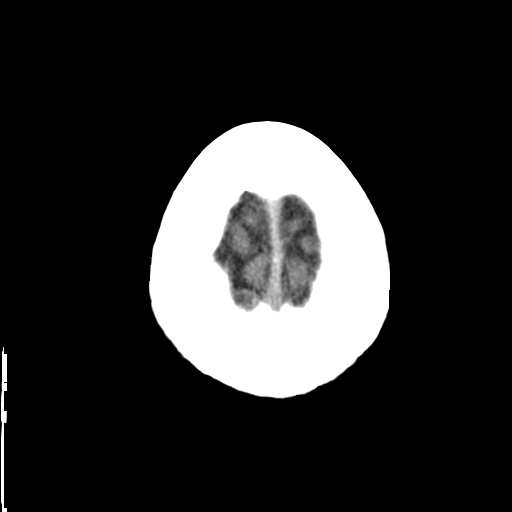
[im 29/31  brain]
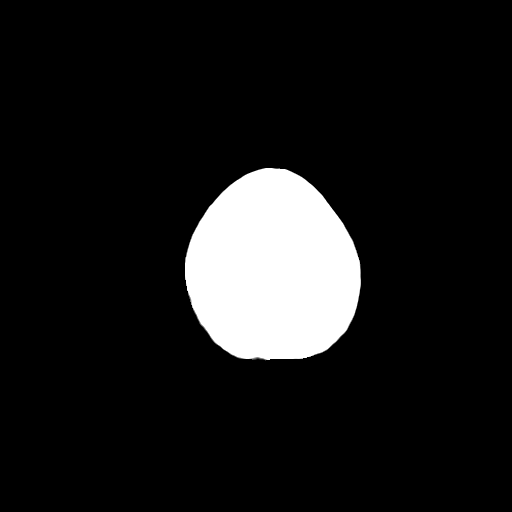

[16 of 30 positions shown; findings below may reference images not displayed]

FINDINGS: The bony calvarium is intact. No gross soft tissue abnormality is
noted. No findings to suggest acute hemorrhage, acute infarction or
space-occupying mass lesion are noted.
IMPRESSION: No acute intracranial abnormality noted.

## 2016-11-07 IMAGING — US US CAROTID DUPLEX BILAT
1 series · 14 of 24 positions shown · non-contrast
Comparison: None.

CLINICAL DATA: Transient ischemic attack.  Syncope.

EXAM:
BILATERAL CAROTID DUPLEX ULTRASOUND
TECHNIQUE: Gray scale imaging, color Doppler and duplex ultrasound were
performed of bilateral carotid and vertebral arteries in the neck.

[Series 1: us carotid duplex bilat · 0.05mm/px · 14 of 57 slices shown]
[im 1/57]
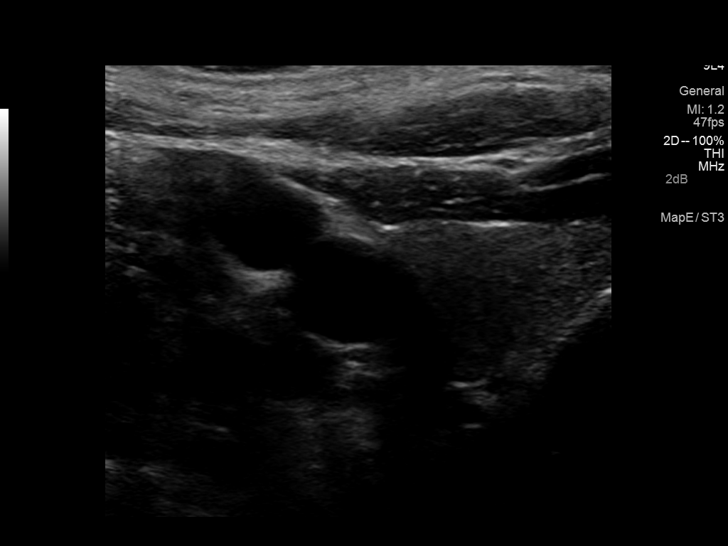
[im 5/57]
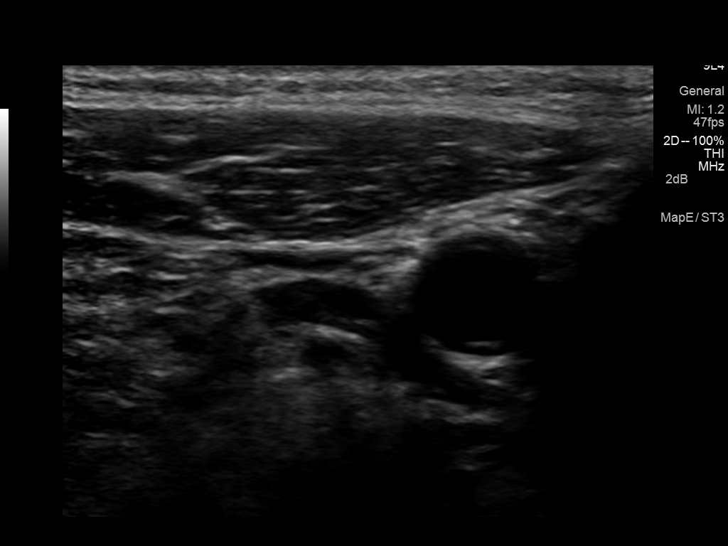
[im 10/57]
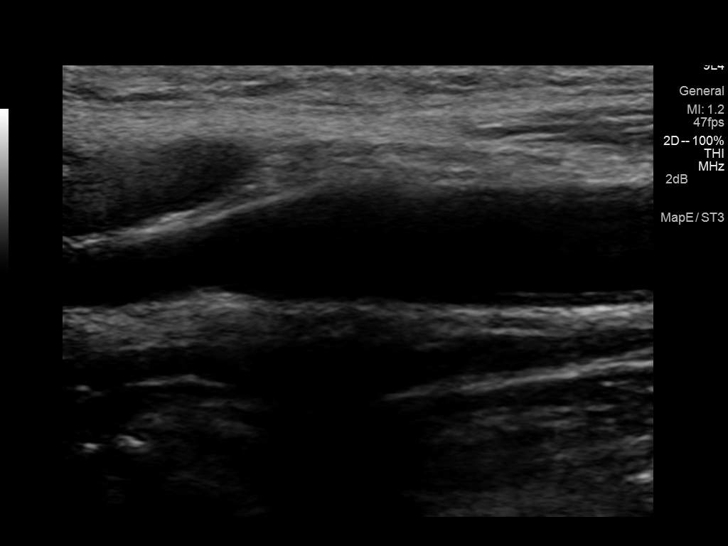
[im 15/57]
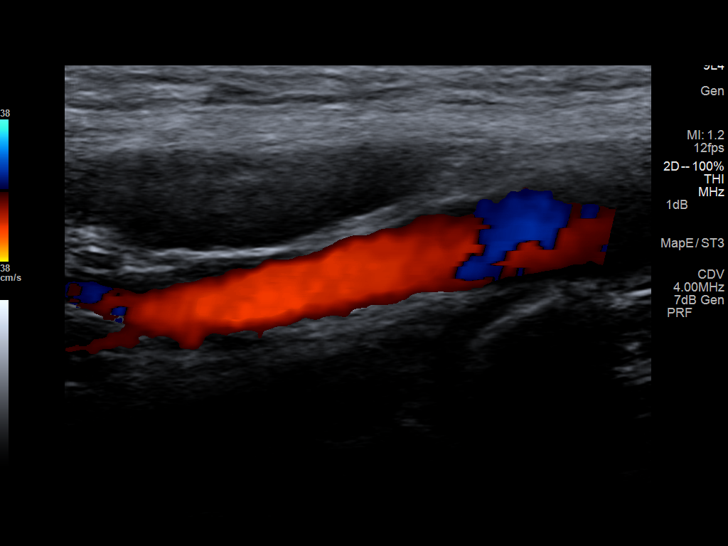
[im 18/57]
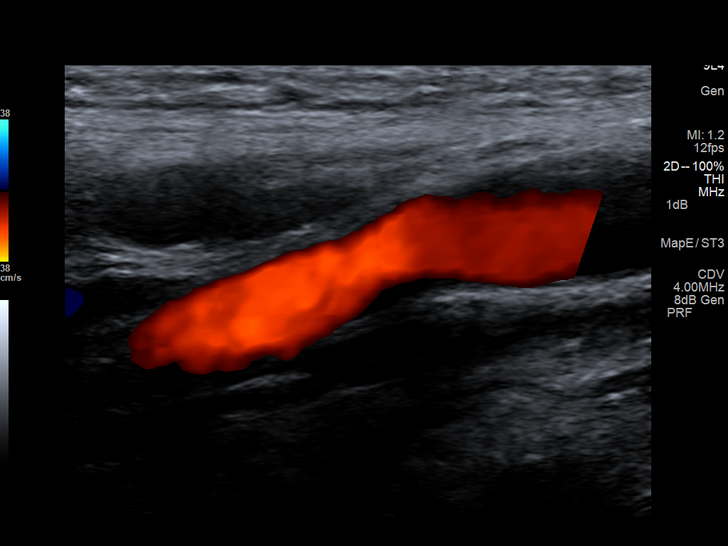
[im 22/57]
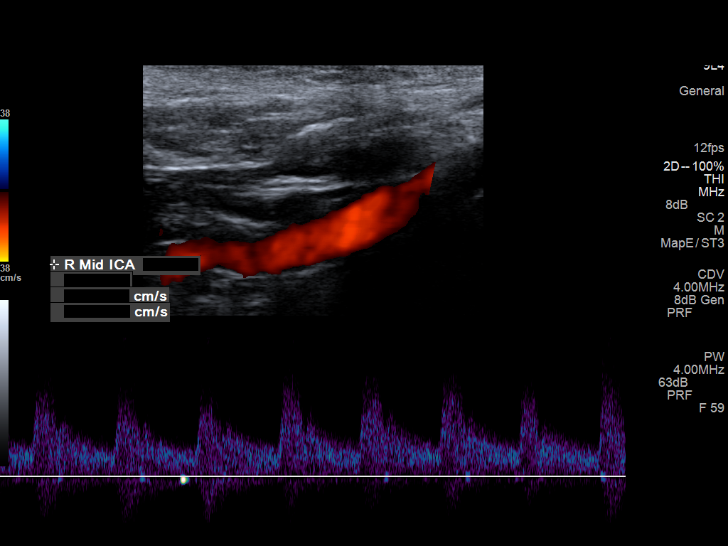
[im 27/57]
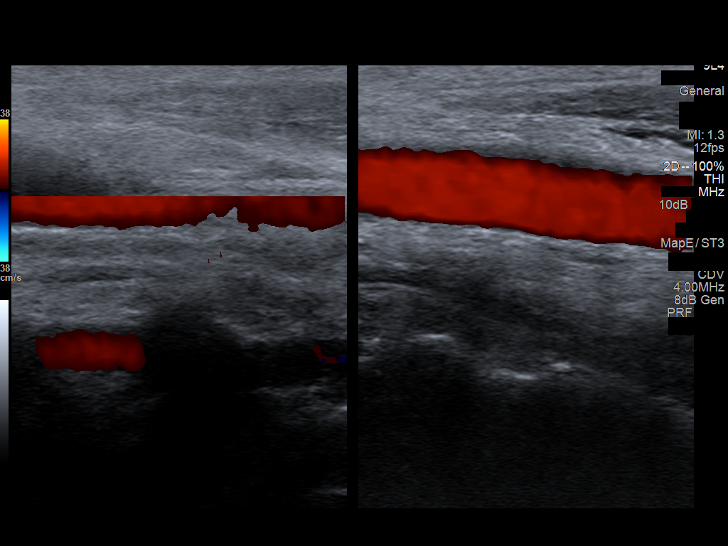
[im 30/57]
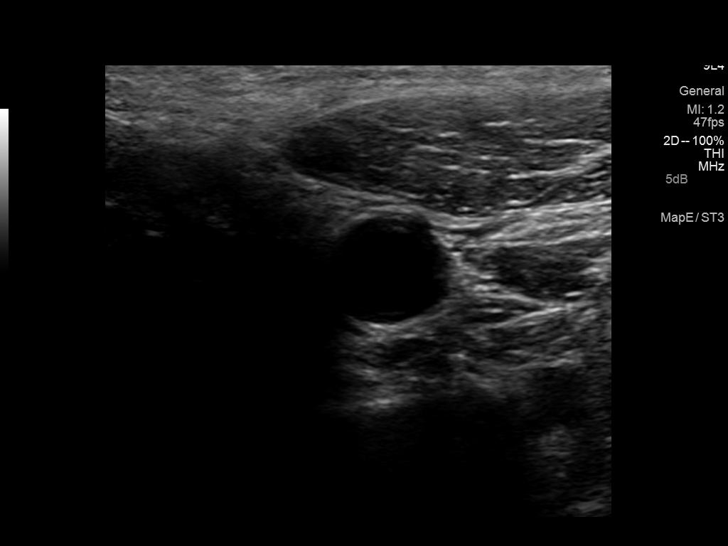
[im 35/57]
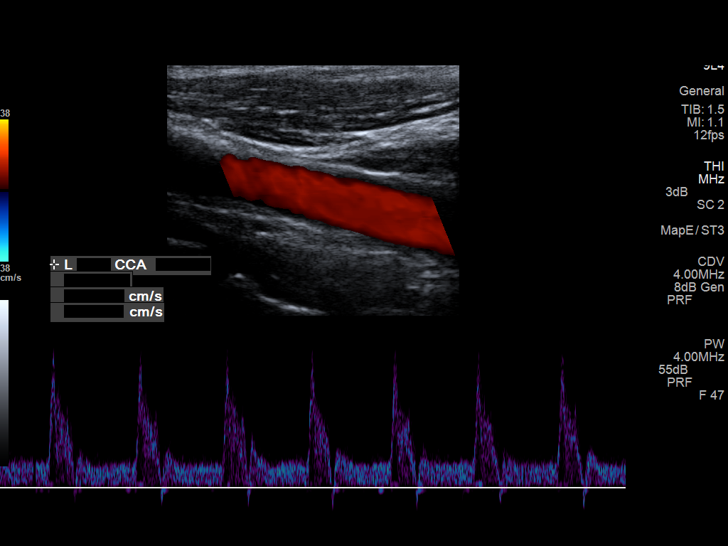
[im 39/57]
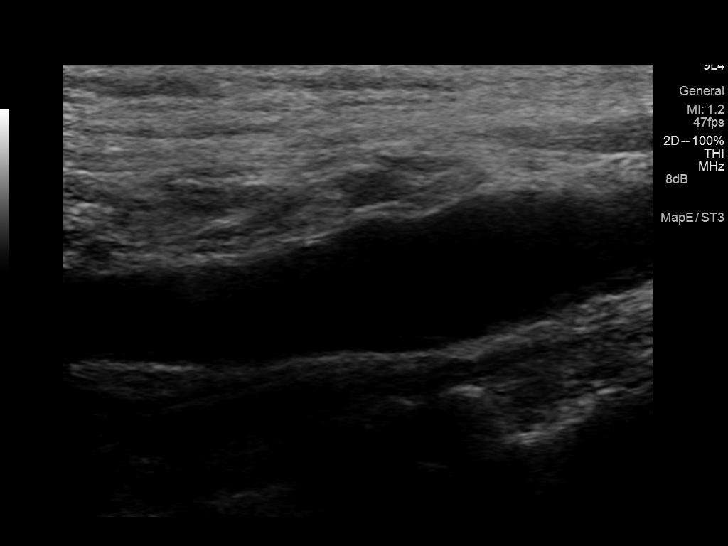
[im 44/57]
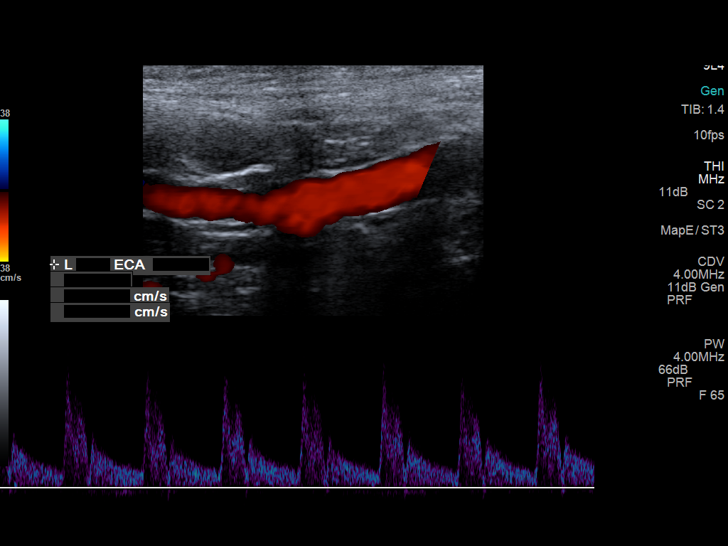
[im 47/57]
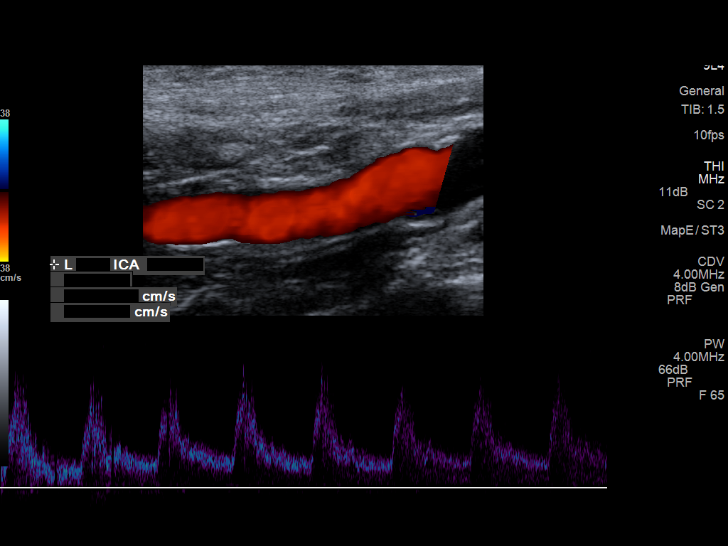
[im 52/57]
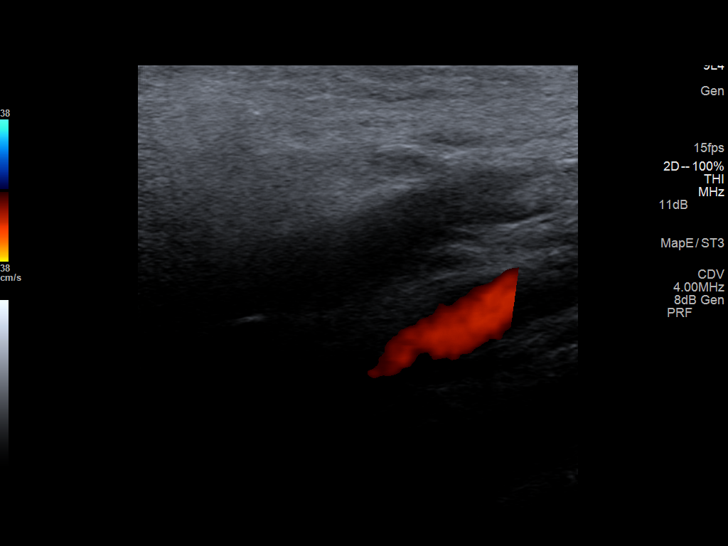
[im 57/57]
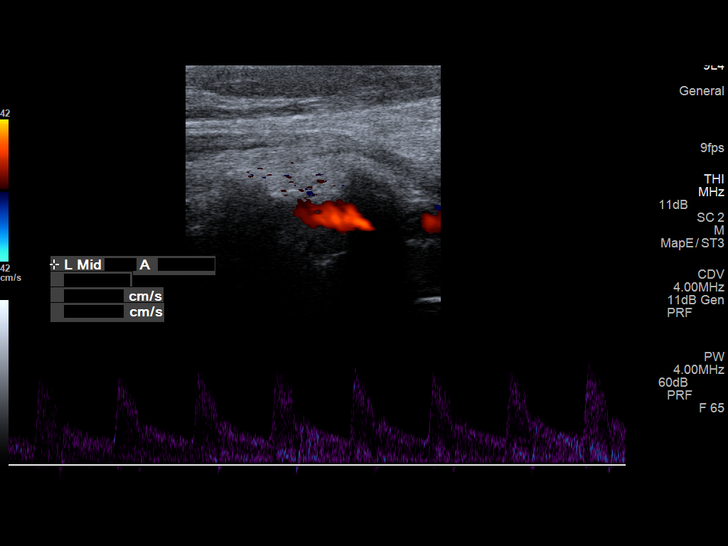

[14 of 24 positions shown; findings below may reference images not displayed]

FINDINGS: Criteria: Quantification of carotid stenosis is based on velocity
parameters that correlate the residual internal carotid diameter
with NASCET-based stenosis levels, using the diameter of the distal
internal carotid lumen as the denominator for stenosis measurement.

The following velocity measurements were obtained:

RIGHT

ICA:  110/36 cm/sec

CCA:  103/26 cm/sec

SYSTOLIC ICA/CCA RATIO:

DIASTOLIC ICA/CCA RATIO:

ECA:  111 cm/sec

LEFT

ICA:  106/28 cm/sec

CCA:  72/19 cm/sec

SYSTOLIC ICA/CCA RATIO:

DIASTOLIC ICA/CCA RATIO:

ECA:  100 cm/sec

RIGHT CAROTID ARTERY: No significant plaque or stenosis is noted.

RIGHT VERTEBRAL ARTERY:  Antegrade flow is noted.

LEFT CAROTID ARTERY:  No significant plaque or stenosis is noted.

LEFT VERTEBRAL ARTERY:  Antegrade flow is noted.
IMPRESSION: No hemodynamically significant stenosis or plaque is noted in either
cervical carotid artery.

## 2017-04-03 ENCOUNTER — Emergency Department
Admission: EM | Admit: 2017-04-03 | Discharge: 2017-04-03 | Disposition: A | Discharge status: Home / Self Care | Payer: Medicare Other | Attending: Emergency Medicine | Admitting: Emergency Medicine

## 2017-04-03 DIAGNOSIS — Y999 Unspecified external cause status: Secondary | ICD-10-CM | POA: Insufficient documentation

## 2017-04-03 DIAGNOSIS — F17210 Nicotine dependence, cigarettes, uncomplicated: Secondary | ICD-10-CM | POA: Diagnosis not present

## 2017-04-03 DIAGNOSIS — Y939 Activity, unspecified: Secondary | ICD-10-CM | POA: Insufficient documentation

## 2017-04-03 DIAGNOSIS — I2510 Atherosclerotic heart disease of native coronary artery without angina pectoris: Secondary | ICD-10-CM | POA: Insufficient documentation

## 2017-04-03 DIAGNOSIS — S51812A Laceration without foreign body of left forearm, initial encounter: Secondary | ICD-10-CM | POA: Insufficient documentation

## 2017-04-03 DIAGNOSIS — Z79899 Other long term (current) drug therapy: Secondary | ICD-10-CM | POA: Insufficient documentation

## 2017-04-03 DIAGNOSIS — I10 Essential (primary) hypertension: Secondary | ICD-10-CM | POA: Insufficient documentation

## 2017-04-03 DIAGNOSIS — J449 Chronic obstructive pulmonary disease, unspecified: Secondary | ICD-10-CM | POA: Insufficient documentation

## 2017-04-03 DIAGNOSIS — Y929 Unspecified place or not applicable: Secondary | ICD-10-CM | POA: Diagnosis not present

## 2017-04-03 DIAGNOSIS — W268XXA Contact with other sharp object(s), not elsewhere classified, initial encounter: Secondary | ICD-10-CM | POA: Insufficient documentation

## 2017-04-03 MED ORDER — BUPIVACAINE HCL (PF) 0.5 % IJ SOLN
INTRAMUSCULAR | Status: AC
  Filled 2017-04-03: qty 30

## 2017-04-03 MED ORDER — TRAMADOL HCL 50 MG PO TABS: 50.0000 mg | ORAL_TABLET | Freq: Four times a day (QID) | ORAL | 0 refills | Status: AC | PRN

## 2017-04-03 NOTE — ED Provider Notes (Signed)
[facility] Emergency Department Provider Note   ____________________________________________   First MD Initiated Contact with Patient 04/03/17 1354     (approximate)  I have reviewed the triage vital signs and the nursing notes.   HISTORY  Chief Complaint Extremity Laceration    HPI Nicholas Norris is a 71 y.o. male patient reports a laceration to the left forearm. Patient issues a chain saw to cut limbs when it kicked back lacerating his arm and contusions chest. Patient state hemorrhage is controlled direct pressure but does take blood thinners. Patient denies loss of sensation or function of the left upper extremity. Patient state his tetanus shot is up-to-date.Patient rates his pain as a 5/10. Patient described a pain as "achy".   Past Medical History:  Diagnosis Date  . A-fib (HCC)   . Arthritis   . BPH (benign prostatic hyperplasia)   . BPH (benign prostatic hypertrophy)   . Chronic insomnia   . COPD (chronic obstructive pulmonary disease) (HCC)   . Coronary artery disease   . Depression   . Depression with anxiety   . Gastroesophageal reflux   . GERD (gastroesophageal reflux disease)   . Hypertension   . Impotence   . Inguinal hernia   . Insomnia   . Low back pain   . Lung nodules   . Memory loss   . TIA (transient ischemic attack)     Patient Active Problem List   Diagnosis Date Noted  . Coronary artery disease   . Hypertension   . Paroxysmal atrial fibrillation (HCC) 03/10/2015  . Tobacco use 03/10/2015  . Gastroenteritis 09/21/2013  . COPD (chronic obstructive pulmonary disease) (HCC) 07/15/2013  . Tobacco use disorder 07/15/2013  . Pulmonary nodules 07/15/2013  . GERD (gastroesophageal reflux disease) 07/15/2013    Past Surgical History:  Procedure Laterality Date  . APPENDECTOMY    . HERNIA REPAIR    . ROTATOR CUFF REPAIR Left     Prior to Admission medications   Medication Sig Start Date End Date Taking?  Authorizing Provider  aspirin 325 MG tablet Take 325 mg by mouth daily.    Historical Provider, MD  atorvastatin (LIPITOR) 40 MG tablet Take 40 mg by mouth at bedtime.    Historical Provider, MD  carvedilol (COREG) 6.25 MG tablet Take 6.25 mg by mouth 2 (two) times daily.    Historical Provider, MD  clopidogrel (PLAVIX) 75 MG tablet Take 75 mg by mouth daily.    Historical Provider, MD  traMADol (ULTRAM) 50 MG tablet Take 50 mg by mouth daily as needed.    Historical Provider, MD  traMADol (ULTRAM) 50 MG tablet Take 1 tablet (50 mg total) by mouth every 6 (six) hours as needed for moderate pain. 04/03/17   Joni Reining, PA-C  valsartan-hydrochlorothiazide (DIOVAN-HCT) 320-25 MG tablet Take 1 tablet by mouth daily.    Historical Provider, MD    Allergies Codeine  Family History  Problem Relation Age of Onset  . Alzheimer's disease Mother   . Heart disease Father   . Heart attack Father     Social History Social History  Substance Use Topics  . Smoking status: Current Every Day Smoker    Packs/day: 2.00    Years: 59.00    Types: Cigarettes  . Smokeless tobacco: Not on file     Comment: pt also uses vapor cig--07/15/13  . Alcohol use No    Review of Systems Constitutional: No fever/chills Eyes: No visual changes. ENT: No sore  throat. Cardiovascular: Denies chest pain. Respiratory: Denies shortness of breath. Gastrointestinal: No abdominal pain.  No nausea, no vomiting.  No diarrhea.  No constipation. Genitourinary: Negative for dysuria. Musculoskeletal:Low back pain  Skin: Negative for rash. Neurological: Negative for headaches, focal weakness or numbness. History of TIA Psychiatric:Anxiety and depression Endocrine:Hypertension and BPH Allergic/Immunilogical: Codeine ____________________________________________   PHYSICAL EXAM:  VITAL SIGNS: ED Triage Vitals  Enc Vitals Group     BP 04/03/17 1237 129/69     Pulse Rate 04/03/17 1237 79     Resp 04/03/17 1237 18      Temp 04/03/17 1237 98.2 F (36.8 C)     Temp Source 04/03/17 1237 Oral     SpO2 04/03/17 1237 96 %     Weight 04/03/17 1238 167 lb (75.8 kg)     Height 04/03/17 1238  (1.778 m)     Head Circumference --      Peak Flow --      Pain Score 04/03/17 1237 5     Pain Loc --      Pain Edu? --      Excl. in GC? --     Constitutional: Alert and oriented. Well appearing and in no acute distress. Eyes: Conjunctivae are normal. PERRL. EOMI. Head: Atraumatic. Nose: No congestion/rhinnorhea. Mouth/Throat: Mucous membranes are moist.  Oropharynx non-erythematous. Neck: No stridor.  No cervical spine tenderness to palpation. Hematological/Lymphatic/Immunilogical: No cervical lymphadenopathy. Cardiovascular: Normal rate, regular rhythm. Grossly normal heart sounds.  Good peripheral circulation. Respiratory: Normal respiratory effort.  No retractions. Lungs CTAB. Gastrointestinal: Soft and nontender. No distention. No abdominal bruits. No CVA tenderness. Musculoskeletal: No lower extremity tenderness nor edema.  No joint effusions. Neurologic:  Normal speech and language. No gross focal neurologic deficits are appreciated. No gait instability. Skin:  Skin is warm, dry and intact. No rash noted.Laceration to left forearm Psychiatric: Mood and affect are normal. Speech and behavior are normal.  ____________________________________________   LABS (all labs ordered are listed, but only abnormal results are displayed)  Labs Reviewed - No data to display ____________________________________________  EKG   ____________________________________________  RADIOLOGY   ____________________________________________   PROCEDURES  Procedure(s) performed: LACERATION REPAIR Performed by: Joni Reining Authorized by: Joni Reining Consent: Verbal consent obtained. Risks and benefits: risks, benefits and alternatives were discussed Consent given by: patient Patient identity confirmed:  provided demographic data Prepped and Draped in normal sterile fashion Wound explored  Laceration Location: Left forearm  Laceration Length: 4 cm  No Foreign Bodies seen or palpated  Anesthesia: local infiltration  Local anesthetic: Sensorcaine   Anesthetic total: 8 ml  Irrigation method: syringe Amount of cleaning: standard  Skin closure: 3-0 nylon and Dermabond  Number of sutures: 10  Technique: Interrupted   Patient tolerance: Patient tolerated the procedure well with no immediate complications.   Procedures  Critical Care performed: No  ____________________________________________   INITIAL IMPRESSION / ASSESSMENT AND PLAN / ED COURSE  Pertinent labs & imaging results that were available during my care of the patient were reviewed by me and considered in my medical decision making (see chart for details).  Left forearm laceration. Patient reprepped for wound closure.      ____________________________________________   FINAL CLINICAL IMPRESSION(S) / ED DIAGNOSES  Final diagnoses:  Forearm laceration, left, initial encounter  Patient given discharge care instruction. Patient given a prescription for tramadol. Patient advised return back 10 days for suture removal    NEW MEDICATIONS STARTED DURING THIS VISIT:  New Prescriptions  TRAMADOL (ULTRAM) 50 MG TABLET    Take 1 tablet (50 mg total) by mouth every 6 (six) hours as needed for moderate pain.     Note:  This document was prepared using Dragon voice recognition software and may include unintentional dictation errors.    Joni Reining, PA-C 04/03/17 1404    Jene Every, MD 04/03/17 [phone]

## 2017-04-03 NOTE — ED Triage Notes (Signed)
Pt in via POV with laceration to left arm, pt reports cutting limbs with a chainsaw, states, "it kicked back, going up my arm and to my chest."  Pt with lacerations to left forearm, bandaged per first nurse, bleeding controlled at this time.  NAD noted at this time.

## 2017-04-03 NOTE — ED Notes (Signed)
Left forearm lac with dressing. +2 radial.  Full ROM left fingers. Left chest lac, not bleeding

## 2017-04-14 ENCOUNTER — Emergency Department
Admission: EM | Admit: 2017-04-14 | Discharge: 2017-04-14 | Disposition: A | Discharge status: Home / Self Care | Payer: Medicare Other | Attending: Emergency Medicine | Admitting: Emergency Medicine

## 2017-04-14 ENCOUNTER — Encounter: Payer: Self-pay | Admitting: Emergency Medicine

## 2017-04-14 DIAGNOSIS — I10 Essential (primary) hypertension: Secondary | ICD-10-CM | POA: Insufficient documentation

## 2017-04-14 DIAGNOSIS — Z79899 Other long term (current) drug therapy: Secondary | ICD-10-CM | POA: Insufficient documentation

## 2017-04-14 DIAGNOSIS — Z4802 Encounter for removal of sutures: Secondary | ICD-10-CM | POA: Insufficient documentation

## 2017-04-14 DIAGNOSIS — F17210 Nicotine dependence, cigarettes, uncomplicated: Secondary | ICD-10-CM | POA: Diagnosis not present

## 2017-04-14 DIAGNOSIS — I2510 Atherosclerotic heart disease of native coronary artery without angina pectoris: Secondary | ICD-10-CM | POA: Diagnosis not present

## 2017-04-14 DIAGNOSIS — Z7982 Long term (current) use of aspirin: Secondary | ICD-10-CM | POA: Diagnosis not present

## 2017-04-14 DIAGNOSIS — J449 Chronic obstructive pulmonary disease, unspecified: Secondary | ICD-10-CM | POA: Insufficient documentation

## 2017-04-14 NOTE — ED Triage Notes (Signed)
Patient here for suture removal left forearm.  10 sutures present without redness, swelling, or drainage.

## 2017-04-14 NOTE — ED Notes (Signed)
States he is here for suture removal to left forearm  Suture line remains intact  No drainage noted

## 2017-04-14 NOTE — Discharge Instructions (Signed)
Follow up with primary care for any concern of infection. Return to the ER for symptoms of concern.

## 2017-04-14 NOTE — ED Provider Notes (Signed)
[facility] Emergency Department Provider Note  ___________________________________________  Time seen: 0930 AM  I have reviewed the triage vital signs and the nursing notes.   HISTORY  Chief Complaint Suture / Staple Removal    HPI Nicholas Norris is a 71 y.o. male who presents to the emergency department for suture removal. He was evaluated here on 04/03/17 after cutting his arm with a chainsaw. He denies new complaints and states he is feeling well overall.   Past Medical History:  Diagnosis Date  . A-fib (HCC)   . Arthritis   . BPH (benign prostatic hyperplasia)   . BPH (benign prostatic hypertrophy)   . Chronic insomnia   . COPD (chronic obstructive pulmonary disease) (HCC)   . Coronary artery disease   . Depression   . Depression with anxiety   . Gastroesophageal reflux   . GERD (gastroesophageal reflux disease)   . Hypertension   . Impotence   . Inguinal hernia   . Insomnia   . Low back pain   . Lung nodules   . Memory loss   . TIA (transient ischemic attack)     Patient Active Problem List   Diagnosis Date Noted  . Coronary artery disease   . Hypertension   . Paroxysmal atrial fibrillation (HCC) 03/10/2015  . Tobacco use 03/10/2015  . Gastroenteritis 09/21/2013  . COPD (chronic obstructive pulmonary disease) (HCC) 07/15/2013  . Tobacco use disorder 07/15/2013  . Pulmonary nodules 07/15/2013  . GERD (gastroesophageal reflux disease) 07/15/2013    Past Surgical History:  Procedure Laterality Date  . APPENDECTOMY    . HERNIA REPAIR    . ROTATOR CUFF REPAIR Left     Current Outpatient Rx  . Order #: [id number] Class: Historical Med  . Order #: [id number] Class: Historical Med  . Order #: [id number] Class: Historical Med  . Order #: [id number] Class: Historical Med  . Order #: [id number] Class: Historical Med  . Order #: [id number] Class: Print  . Order #: [id number] Class: Historical Med    Allergies Codeine  Family History  Problem  Relation Age of Onset  . Alzheimer's disease Mother   . Heart disease Father   . Heart attack Father     Social History Social History  Substance Use Topics  . Smoking status: Current Every Day Smoker    Packs/day: 2.00    Years: 59.00    Types: Cigarettes  . Smokeless tobacco: Not on file     Comment: pt also uses vapor cig--07/15/13  . Alcohol use No    Review of Systems  Constitutional: Denies fever.  HEENT: No change from baseline Respiratory: No cough or shortness of breath Musculoskeletal: No pain. Skin: healing wound; pain gradually resolving.  ____________________________________________   PHYSICAL EXAM:  VITAL SIGNS: ED Triage Vitals  Enc Vitals Group     BP 04/14/17 0851 126/68     Pulse Rate 04/14/17 0851 67     Resp --      Temp 04/14/17 0851 97.8 F (36.6 C)     Temp Source 04/14/17 0851 Oral     SpO2 04/14/17 0851 98 %     Weight 04/14/17 0852 167 lb (75.8 kg)     Height 04/14/17 0852  (1.778 m)     Head Circumference --      Peak Flow --      Pain Score --      Pain Loc --      Pain Edu? --      Excl. in  GC? --     Constitutional: Appears well. No distress HEENT: Atraumtaic, normal appearance, EOMI, sclera normal, voice normal. Respiratory: Respirations even and unlabored.  Cardiovascular: Capillary refill normal. Peripheral pulses 2+ Musculoskeletal: Full ROM x 4. Skin: Well approximated. No evidence of infection or cellulitis. Neurovascular: Gait steady; Alert and oriented x 4.   PROCEDURES  Procedure(s) performed: SUTURE REMOVAL Performed by:   Consent: Verbal consent obtained. Patient identity confirmed: provided demographic data Time out: Immediately prior to procedure a "time out" was called to verify the correct patient, procedure, equipment, support staff and site/side marked as required.  Location details: right forearm  Wound Appearance: clean  Sutures/Staples Removed: by RN  Facility: sutures placed in this  facility  Patient tolerance: Patient tolerated the procedure well with no immediate complications.    ____________________________________________   INITIAL IMPRESSION / ASSESSMENT AND PLAN / ED COURSE  Pertinent labs & imaging results that were available during my care of the patient were reviewed by me and considered in my medical decision making (see chart for details).  Wound care discussed. Patient advised to keep covered with sunscreen. Patient was advised to return to the ER for symptoms that change or worsen if unable to schedule an appointment with primary care.  ____________________________________________   FINAL CLINICAL IMPRESSION(S) / ED DIAGNOSES  Final diagnoses:  Visit for suture removal       Chinita Pester, FNP 04/14/17 1612    Governor Rooks, MD 04/22/17 [phone]
